# Patient Record
Sex: Male | Born: 1970 | Race: White | Hispanic: No | Marital: Married | Smoking: Former smoker
Health system: Southern US, Community
[De-identification: ages and names within clinical notes are randomized; demographics above are authoritative.]

## PROBLEM LIST (undated history)

## (undated) DIAGNOSIS — I10 Essential (primary) hypertension: Secondary | ICD-10-CM

## (undated) DIAGNOSIS — H3552 Pigmentary retinal dystrophy: Secondary | ICD-10-CM

## (undated) HISTORY — PX: HERNIA REPAIR: SHX51

---

## 2004-08-24 ENCOUNTER — Emergency Department (HOSPITAL_COMMUNITY): Admission: EM | Admit: 2004-08-24 | Discharge: 2004-08-25 | Payer: Self-pay | Admitting: *Deleted

## 2009-07-19 ENCOUNTER — Emergency Department (HOSPITAL_COMMUNITY): Admission: EM | Admit: 2009-07-19 | Discharge: 2009-07-20 | Payer: Self-pay | Admitting: Emergency Medicine

## 2009-08-20 ENCOUNTER — Ambulatory Visit (HOSPITAL_COMMUNITY): Admission: RE | Admit: 2009-08-20 | Discharge: 2009-08-20 | Payer: Self-pay | Admitting: General Surgery

## 2011-01-27 LAB — BASIC METABOLIC PANEL
BUN: 9 mg/dL (ref 6–23)
CO2: 31 mEq/L (ref 19–32)
Calcium: 9.5 mg/dL (ref 8.4–10.5)
Chloride: 105 mEq/L (ref 96–112)
Creatinine, Ser: 0.89 mg/dL (ref 0.4–1.5)
GFR calc Af Amer: >60 mL/min (ref >60)
GFR calc non Af Amer: >60 mL/min (ref >60)
Glucose, Bld: 110 mg/dL — ABNORMAL HIGH (ref 70–99)
Potassium: 4.3 mEq/L (ref 3.5–5.1)
Sodium: 141 mEq/L (ref 135–145)

## 2011-01-27 LAB — CBC
HCT: 46.2 % (ref 39.0–52.0)
Hemoglobin: 15.7 g/dL (ref 13.0–17.0)
MCHC: 34 g/dL (ref 30.0–36.0)
MCV: 94.7 fL (ref 78.0–100.0)
Platelets: 204 10*3/uL (ref 150–400)
RBC: 4.87 MIL/uL (ref 4.22–5.81)
RDW: 13.2 % (ref 11.5–15.5)
WBC: 11.3 10*3/uL — ABNORMAL HIGH (ref 4.0–10.5)

## 2011-01-27 LAB — DIFFERENTIAL
Basophils Absolute: 0.1 10*3/uL (ref 0.0–0.1)
Basophils Relative: 1 % (ref 0–1)
Eosinophils Absolute: 0.5 10*3/uL (ref 0.0–0.7)
Eosinophils Relative: 5 % (ref 0–5)
Lymphocytes Relative: 32 % (ref 12–46)
Lymphs Abs: 3.6 10*3/uL (ref 0.7–4.0)
Monocytes Absolute: 0.8 10*3/uL (ref 0.1–1.0)
Monocytes Relative: 7 % (ref 3–12)
Neutro Abs: 6.2 10*3/uL (ref 1.7–7.7)
Neutrophils Relative %: 55 % (ref 43–77)

## 2011-01-27 LAB — URINALYSIS, ROUTINE W REFLEX MICROSCOPIC
Bilirubin Urine: NEGATIVE
Glucose, UA: NEGATIVE mg/dL
Hgb urine dipstick: NEGATIVE
Ketones, ur: NEGATIVE mg/dL
Nitrite: NEGATIVE
Protein, ur: NEGATIVE mg/dL
Specific Gravity, Urine: 1.023 (ref 1.005–1.030)
Urobilinogen, UA: 0.2 mg/dL (ref 0.0–1.0)
pH: 5.5 (ref 5.0–8.0)

## 2011-01-27 LAB — PROTIME-INR
INR: 0.95 (ref 0.00–1.49)
Prothrombin Time: 12.6 seconds (ref 11.6–15.2)

## 2011-01-27 LAB — URINE MICROSCOPIC-ADD ON

## 2011-01-27 LAB — APTT: aPTT: 29 s (ref 24–37)

## 2014-11-25 ENCOUNTER — Ambulatory Visit (HOSPITAL_COMMUNITY)
Admission: RE | Admit: 2014-11-25 | Discharge: 2014-11-25 | Disposition: A | Discharge status: Home / Self Care | Payer: BLUE CROSS/BLUE SHIELD | Source: Ambulatory Visit | Attending: Family Medicine | Admitting: Family Medicine

## 2014-11-25 ENCOUNTER — Other Ambulatory Visit (HOSPITAL_COMMUNITY): Payer: Self-pay | Admitting: Family Medicine

## 2014-11-25 DIAGNOSIS — F17200 Nicotine dependence, unspecified, uncomplicated: Secondary | ICD-10-CM | POA: Insufficient documentation

## 2014-11-25 DIAGNOSIS — R1900 Intra-abdominal and pelvic swelling, mass and lump, unspecified site: Secondary | ICD-10-CM

## 2014-11-25 DIAGNOSIS — R222 Localized swelling, mass and lump, trunk: Secondary | ICD-10-CM | POA: Insufficient documentation

## 2014-11-25 DIAGNOSIS — N5089 Other specified disorders of the male genital organs: Secondary | ICD-10-CM

## 2014-11-26 ENCOUNTER — Other Ambulatory Visit (HOSPITAL_COMMUNITY): Payer: Self-pay | Admitting: Family Medicine

## 2014-11-26 DIAGNOSIS — N5089 Other specified disorders of the male genital organs: Secondary | ICD-10-CM

## 2014-11-26 DIAGNOSIS — N63 Unspecified lump in unspecified breast: Secondary | ICD-10-CM

## 2014-12-01 ENCOUNTER — Ambulatory Visit (HOSPITAL_COMMUNITY)
Admission: RE | Admit: 2014-12-01 | Discharge: 2014-12-01 | Disposition: A | Discharge status: Home / Self Care | Payer: BLUE CROSS/BLUE SHIELD | Source: Ambulatory Visit | Attending: Family Medicine | Admitting: Family Medicine

## 2014-12-01 DIAGNOSIS — N508 Other specified disorders of male genital organs: Secondary | ICD-10-CM | POA: Insufficient documentation

## 2014-12-01 DIAGNOSIS — N5089 Other specified disorders of the male genital organs: Secondary | ICD-10-CM

## 2014-12-09 ENCOUNTER — Other Ambulatory Visit (HOSPITAL_COMMUNITY): Payer: Self-pay | Admitting: Family Medicine

## 2014-12-09 ENCOUNTER — Ambulatory Visit (HOSPITAL_COMMUNITY)
Admission: RE | Admit: 2014-12-09 | Discharge: 2014-12-09 | Disposition: A | Discharge status: Home / Self Care | Payer: BLUE CROSS/BLUE SHIELD | Source: Ambulatory Visit | Attending: Family Medicine | Admitting: Family Medicine

## 2014-12-09 DIAGNOSIS — N63 Unspecified lump in unspecified breast: Secondary | ICD-10-CM

## 2015-06-18 ENCOUNTER — Ambulatory Visit: Payer: Worker's Compensation

## 2015-06-18 ENCOUNTER — Ambulatory Visit
Admission: EM | Admit: 2015-06-18 | Discharge: 2015-06-18 | Disposition: A | Discharge status: Home / Self Care | Payer: Worker's Compensation | Attending: Family Medicine | Admitting: Family Medicine

## 2015-06-18 DIAGNOSIS — S62512A Displaced fracture of proximal phalanx of left thumb, initial encounter for closed fracture: Secondary | ICD-10-CM

## 2015-06-18 HISTORY — DX: Pigmentary retinal dystrophy: H35.52

## 2015-06-18 HISTORY — DX: Essential (primary) hypertension: I10

## 2015-06-18 MED ORDER — MELOXICAM 15 MG PO TABS: 15.0000 mg | ORAL_TABLET | Freq: Every day | ORAL | Status: DC

## 2015-06-18 NOTE — Discharge Instructions (Signed)
Cast or Splint Care Casts and splints support injured limbs and keep bones from moving while they heal.  HOME CARE  Keep the cast or splint uncovered during the drying period.  A plaster cast can take 24 to 48 hours to dry.  A fiberglass cast will dry in less than 1 hour.  Do not rest the cast on anything harder than a pillow for 24 hours.  Do not put weight on your injured limb. Do not put pressure on the cast. Wait for your doctor's approval.  Keep the cast or splint dry.  Cover the cast or splint with a plastic bag during baths or wet weather.  If you have a cast over your chest and belly (trunk), take sponge baths until the cast is taken off.  If your cast gets wet, dry it with a towel or blow dryer. Use the cool setting on the blow dryer.  Keep your cast or splint clean. Wash a dirty cast with a damp cloth.  Do not put any objects under your cast or splint.  Do not scratch the skin under the cast with an object. If itching is a problem, use a blow dryer on a cool setting over the itchy area.  Do not trim or cut your cast.  Do not take out the padding from inside your cast.  Exercise your joints near the cast as told by your doctor.  Raise (elevate) your injured limb on 1 or 2 pillows for the first 1 to 3 days. GET HELP IF:  Your cast or splint cracks.  Your cast or splint is too tight or too loose.  You itch badly under the cast.  Your cast gets wet or has a soft spot.  You have a bad smell coming from the cast.  You get an object stuck under the cast.  Your skin around the cast becomes red or sore.  You have new or more pain after the cast is put on. GET HELP RIGHT AWAY IF:  You have fluid leaking through the cast.  You cannot move your fingers or toes.  Your fingers or toes turn blue or white or are cool, painful, or puffy (swollen).  You have tingling or lose feeling (numbness) around the injured area.  You have bad pain or pressure under the  cast.  You have trouble breathing or have shortness of breath.  You have chest pain. Document Released: 02/09/2011 Document Revised: 06/12/2013 Document Reviewed: 04/18/2013 [facility] Patient Information 2015 [place], [place]. This information is not intended to replace advice given to you by your health care provider. Make sure you discuss any questions you have with your health care provider.  Finger Fracture A finger fracture is when one or more bones in the finger break.  HOME CARE   Wear the splint, tape, or cast as long as told by your doctor.  Keep your fingers in the position your doctor tell you to.  Raise (elevate) the injured area above the level of the heart.  Only take medicine as told by your doctor.  Put ice on the injured area.  Put ice in a plastic bag.  Place a towel between the skin and the bag.  Leave the ice on for 15-20 minutes, 03-04 times a day.  Follow up with your doctor.  Ask what exercises you can do when the splint comes off. GET HELP RIGHT AWAY IF:   The fingernails are white or bluish.  You have pain not helped by medicine.  You cannot move your fingertips.  You lose feeling (numbness) in the injured finger(s). MAKE SURE YOU:   Understand these instructions.  Will watch this condition.  Will get help right away if you are not doing well or get worse. Document Released: 03/28/2008 Document Revised: 01/02/2012 Document Reviewed: 03/28/2008 [facility] Patient Information 2015 [place], [place]. This information is not intended to replace advice given to you by your health care provider. Make sure you discuss any questions you have with your health care provider.  Thumb Fracture  There are many types of thumb fractures (breaks). There are different ways of treating these fractures, all of which may be correct, varying from case to case. Your caregiver will discuss different ways to treat these fractures with you. TREATMENT   Immobilization.  This means the fracture is casted as it is without changing the positions of the fracture (bone pieces) involved. This fracture is casted in a "thumb spica" also called a hitchhiker cast. It is generally left on for 2 to 6 weeks.  Closed reduction. The bones are manipulated back into position without using surgery.  ORIF (open reduction and internal fixation). The fracture site is opened and the bone pieces are fixed into place with some type of hardware such as screws or wires. Your caregiver will discuss the type of fracture you have and the treatment that will be best for that problem. If surgery is the treatment of choice, the following is information for you to know and to let your caregiver know about prior to surgery. LET YOUR CAREGIVERS KNOW ABOUT:  Allergies.  Medications taken including herbs, eye drops, over the counter medications, and creams.  Use of steroids (by mouth or creams).  Previous problems with anesthetics or Novocain.  Family history of anesthetic complications..  Possibility of pregnancy, if this applies.  History of blood clots (thrombophlebitis).  History of bleeding or blood problems.  Previous surgery.  Other health problems. AFTER THE PROCEDURE  After surgery, you will be taken to the recovery area. A nurse will watch and check your progress. Once you are awake, stable, and taking fluids well, barring other problems you will be allowed to go home. Once home, an ice pack applied to your operative site may help with discomfort and keep the swelling down. Elevate your hand above your heart as much as possible for the first 4-5 days after the injury/surgery. HOME CARE INSTRUCTIONS   Follow your caregiver's instructions as to activities, exercises, physical therapy, and driving a car.  Use thumb and exercise as directed.  Only take over-the-counter or prescription medicines for pain, discomfort, or fever as directed by your caregiver. Do not take aspirin  until your caregiver instructs. This can increase bleeding immediately following surgery. SEEK MEDICAL CARE IF:   There is increased bleeding (more than a small spot) from the wound or from beneath your cast or splint.  There is redness, swelling, or increasing pain in the wound or from beneath your cast or splint.  You have pus coming from wound or from beneath your cast or splint.  An unexplained oral temperature above 102 F (38.9 C) develops.  There is a foul smell coming from the wound or dressing or from beneath your cast or splint. SEEK IMMEDIATE MEDICAL CARE IF:   You develop severe pain, decreased sensation such as numbness or tingling.  You develop a rash.  You have difficulty breathing.  Youhave any allergic problems. If you do not have a window in your cast for  observing the wound, a discharge or minor bleeding may show up as a stain on the outside of your cast. Report these findings to your caregiver. If you have a removable splint overlying the surgical dressings it is common to see a small amount of bleeding. Change the dressings as instructed by your caregiver. Document Released: 07/09/2003 Document Revised: 01/02/2012 Document Reviewed: 12/13/2013 [facility] Patient Information 2015 [place], [place]. This information is not intended to replace advice given to you by your health care provider. Make sure you discuss any questions you have with your health care provider.

## 2015-06-18 NOTE — ED Notes (Signed)
Pt states "I was on the third step of a short ladder, so my fall was 4 ft at the most."

## 2015-06-18 NOTE — ED Notes (Addendum)
Pt states "I was on a ladder at work yesterday and missed a step on the ladder and fell to the floor, I landed on my left hand, and am having left thumb pain and swelling. I also scraped my left shoulder but it feels okay this morning, kind of like a rug burn." Pt denies hitting head, no LOC.

## 2015-06-19 NOTE — ED Provider Notes (Signed)
CSN: [id number]     Arrival date & time 06/18/15  0848 History   First MD Initiated Contact with Patient 06/18/15 [phone]     Chief Complaint  Patient presents with  . Hand Injury  . Fall   patient reports falling on his left hand to catch himself when he fell off stepladder landing on his left hand flexed at the wrist. He denies any head trauma or head injury but states that his left hand started hurting and progressively got worse. The injury occurred on Monday during the day. (Consider location/radiation/quality/duration/timing/severity/associated sxs/prior Treatment) Patient is a 44 y.o. male presenting with hand injury and fall. The history is provided by the patient. No language interpreter was used.  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 Hypertension   . Retinitis pigmentosa    Past Surgical History  Procedure Laterality Date  . Hernia repair     History reviewed. No pertinent family history. Social History  Substance Use Topics  . Smoking status: Current Every Day Smoker -- 1.00 packs/day  . Smokeless tobacco: None  . Alcohol Use: No    Review of Systems  Musculoskeletal: Negative for back pain and neck pain.  All other systems reviewed and are negative.   Allergies  Review of patient's allergies indicates no known allergies.  Home Medications   Prior  to Admission medications   Medication Sig Start Date End Date Taking? Authorizing Provider  HYDROcodone-acetaminophen (NORCO/VICODIN) 5-325 MG per tablet Take 1 tablet by mouth every 6 (six) hours as needed for moderate pain (Tooth extraction).   Yes Historical Provider, MD  meloxicam (MOBIC) 15 MG tablet Take 1 tablet (15 mg total) by mouth daily. 06/18/15   Hassan Rowan, MD   Meds Ordered and Administered this Visit  Medications - No data to display  BP 154/100 mmHg  Pulse 75  Temp(Src) 98.5 F (36.9 C) (Tympanic)  Resp 16  Ht 5\' 9"  (1.753 m)  Wt 230 lb (104.327 kg)  BMI 33.95 kg/m2  SpO2 99% No data found.   Physical Exam  Constitutional: He is oriented to person, place, and time. He appears well-developed and well-nourished.  HENT:  Head: Normocephalic.  Musculoskeletal:       Left hand: He exhibits tenderness and bony tenderness.       Hands: The left hand swollen specialist thumb this tenderness in the navicular area and over the metacarpal and phalangeal area of the thumb. She has difficulty flexing the thumb and the thumb.  Neurological: He is alert and oriented to person, place, and time.  Skin: Skin is warm and dry. No erythema.  Vitals reviewed.   ED Course  Procedures (including critical care time)  Labs Review Labs Reviewed -  No data to display  Imaging Review Dg Wrist Complete Left  06/18/2015   CLINICAL DATA:  Fall off ladder yesterday with left wrist injury and pain. Initial encounter.  EXAM: LEFT WRIST - COMPLETE 3+ VIEW  COMPARISON:  None.  FINDINGS: There is no evidence of fracture or dislocation. There is no evidence of arthropathy or other focal bone abnormality. Soft tissue swelling overlying the proximal fifth metatarsal is noted.  IMPRESSION: Soft tissue swelling without bony abnormality.   Electronically Signed   By: Harmon Pier M.D.   On: 06/18/2015 10:41   Dg Hand Complete Left  06/18/2015   CLINICAL DATA:  Fall off ladder, left wrist/hand injury,  pain at base of thumb  EXAM: LEFT HAND - COMPLETE 3+ VIEW  COMPARISON:  None.  FINDINGS: Nondisplaced fracture at the base of the 1st proximal phalanx.  The joint spaces are preserved.  Visualized soft tissues are within normal limits.  IMPRESSION: Nondisplaced fracture at the base of the 1st proximal phalanx.   Electronically Signed   By: Charline Bills M.D.   On: 06/18/2015 10:45     Visual Acuity Review  Right Eye Distance:   Left Eye Distance:   Bilateral Distance:    Right Eye Near:   Left Eye Near:    Bilateral Near:        MDM   1. Fracture of proximal phalanx of thumb, left, closed, initial encounter    Initially concerned about patient fall may have injured his navicular bone but x-ray was negative for navicular fracture.    patient with fracture of the left proximal phalanx. Because of the fracture will be referred to orthopedic. Mobic 15 mg 1 tablet day thenar gutter  splint will be applied as well. Explained to him he will need to see an orthopedic approved by his workers comp and Oncologist people. No use of his left hand until cleared by the orthopedic.   Hassan Rowan, MD 06/19/15 2240

## 2016-02-05 IMAGING — CR DG WRIST COMPLETE 3+V*L*
4 series · 4 of 4 positions shown · non-contrast
Comparison: None.

CLINICAL DATA: Fall off ladder yesterday with left wrist injury and
pain. Initial encounter.

EXAM:
LEFT WRIST - COMPLETE 3+ VIEW

[wrist pa (1 of 2)]
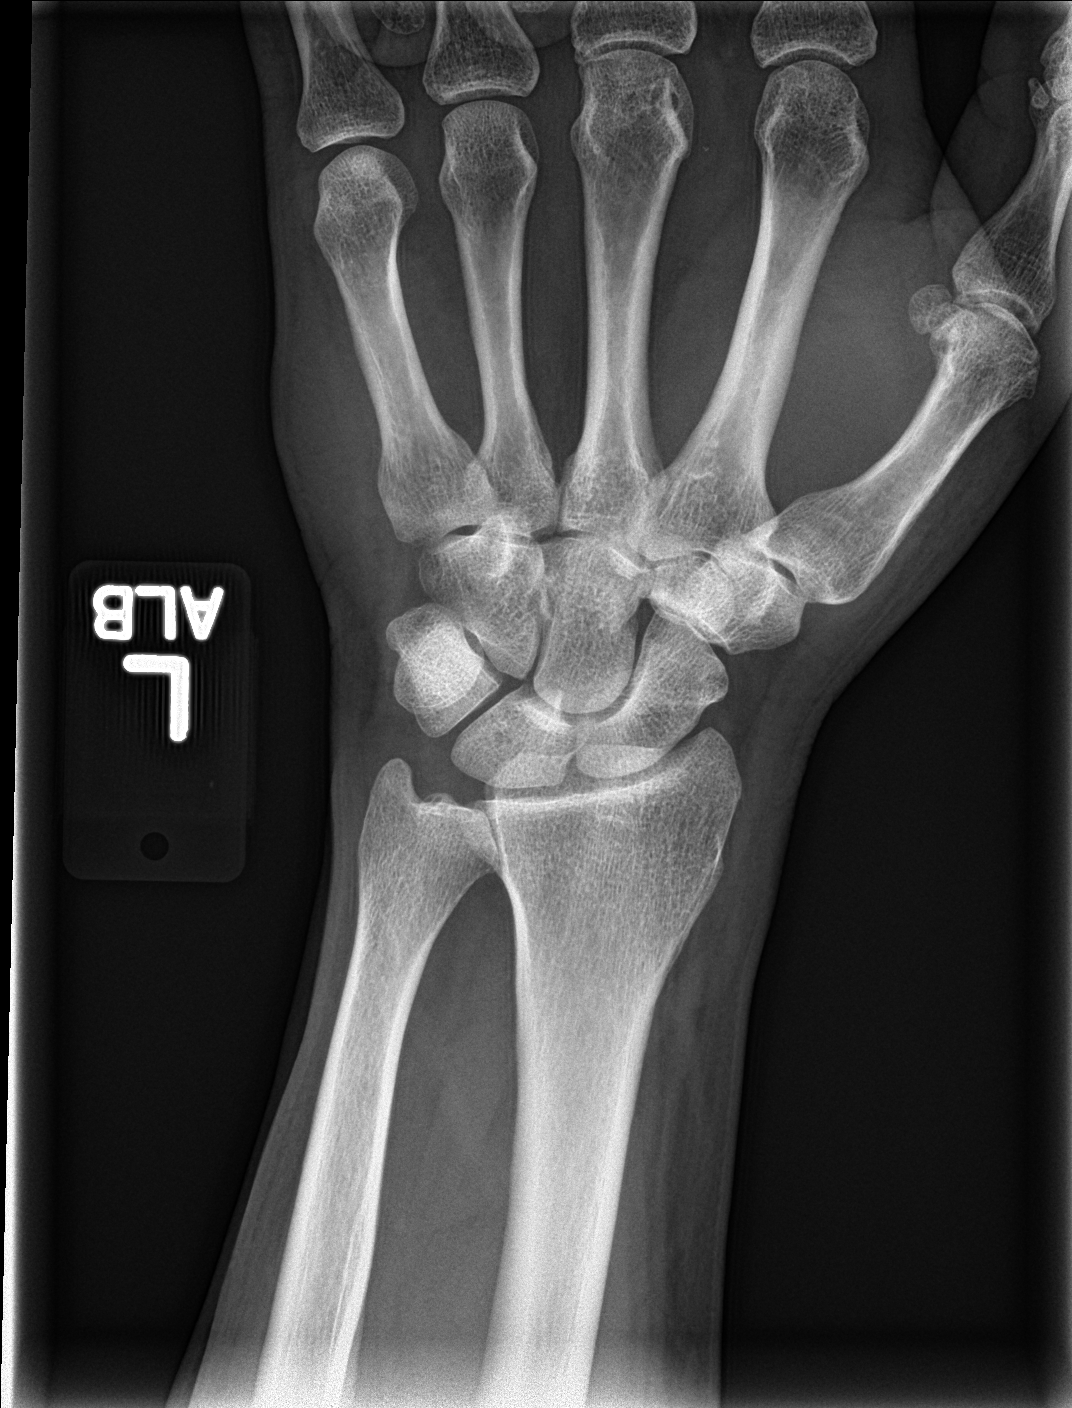

[wrist obl]
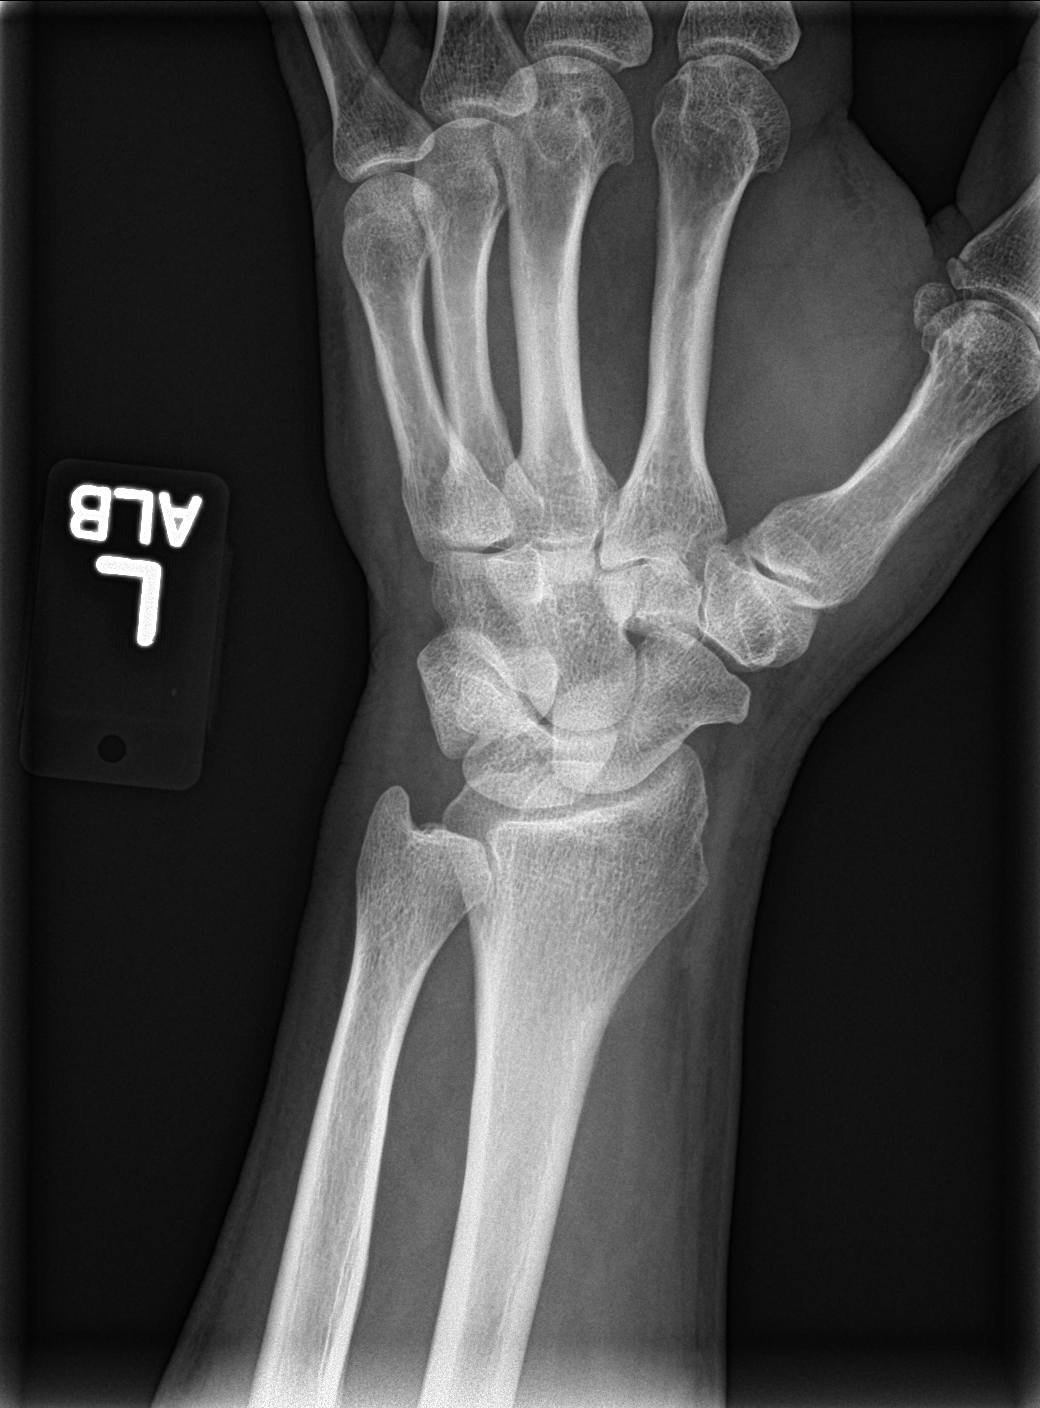

[wrist lat]
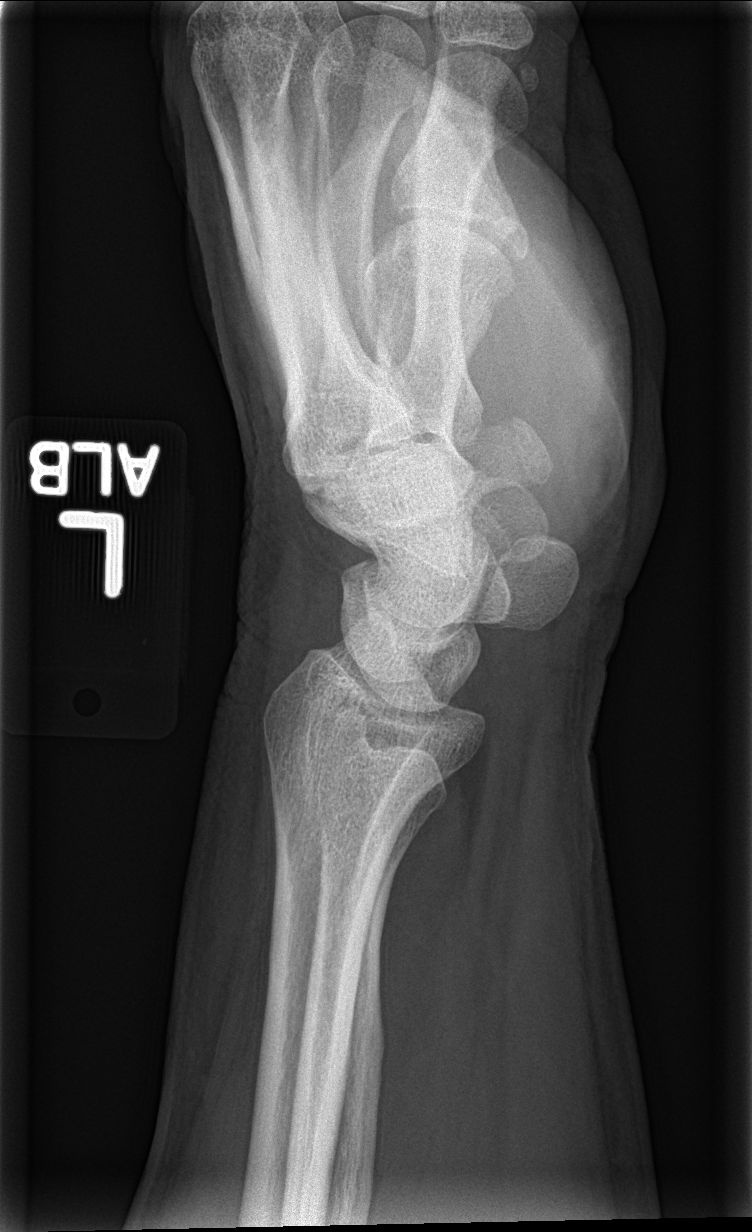

[wrist pa (2 of 2)]
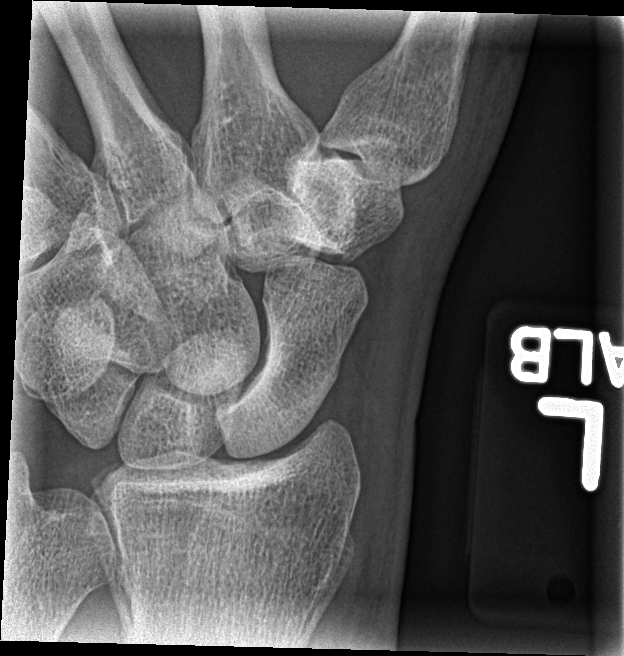

[4 of 4 positions shown; findings below may reference images not displayed]

FINDINGS: There is no evidence of fracture or dislocation. There is no
evidence of arthropathy or other focal bone abnormality. Soft tissue
swelling overlying the proximal fifth metatarsal is noted.
IMPRESSION: Soft tissue swelling without bony abnormality.

## 2020-05-12 DIAGNOSIS — I10 Essential (primary) hypertension: Secondary | ICD-10-CM | POA: Diagnosis not present

## 2020-05-12 DIAGNOSIS — J329 Chronic sinusitis, unspecified: Secondary | ICD-10-CM | POA: Diagnosis not present

## 2020-05-21 DIAGNOSIS — H25812 Combined forms of age-related cataract, left eye: Secondary | ICD-10-CM | POA: Diagnosis not present

## 2020-05-21 DIAGNOSIS — Z961 Presence of intraocular lens: Secondary | ICD-10-CM | POA: Diagnosis not present

## 2020-05-21 DIAGNOSIS — H3552 Pigmentary retinal dystrophy: Secondary | ICD-10-CM | POA: Diagnosis not present

## 2020-05-21 DIAGNOSIS — B399 Histoplasmosis, unspecified: Secondary | ICD-10-CM | POA: Diagnosis not present

## 2020-05-28 DIAGNOSIS — H26491 Other secondary cataract, right eye: Secondary | ICD-10-CM | POA: Diagnosis not present

## 2020-06-17 DIAGNOSIS — Z961 Presence of intraocular lens: Secondary | ICD-10-CM | POA: Diagnosis not present

## 2020-06-17 DIAGNOSIS — H25812 Combined forms of age-related cataract, left eye: Secondary | ICD-10-CM | POA: Diagnosis not present

## 2020-06-17 DIAGNOSIS — H3552 Pigmentary retinal dystrophy: Secondary | ICD-10-CM | POA: Diagnosis not present

## 2020-06-17 DIAGNOSIS — B399 Histoplasmosis, unspecified: Secondary | ICD-10-CM | POA: Diagnosis not present

## 2020-06-18 DIAGNOSIS — H3552 Pigmentary retinal dystrophy: Secondary | ICD-10-CM | POA: Diagnosis not present

## 2020-07-02 DIAGNOSIS — J329 Chronic sinusitis, unspecified: Secondary | ICD-10-CM | POA: Diagnosis not present

## 2020-07-02 DIAGNOSIS — Z20828 Contact with and (suspected) exposure to other viral communicable diseases: Secondary | ICD-10-CM | POA: Diagnosis not present

## 2020-07-17 DIAGNOSIS — H25812 Combined forms of age-related cataract, left eye: Secondary | ICD-10-CM | POA: Diagnosis not present

## 2020-08-20 DIAGNOSIS — M722 Plantar fascial fibromatosis: Secondary | ICD-10-CM | POA: Diagnosis not present

## 2020-10-19 DIAGNOSIS — Z20822 Contact with and (suspected) exposure to covid-19: Secondary | ICD-10-CM | POA: Diagnosis not present

## 2020-10-19 DIAGNOSIS — R051 Acute cough: Secondary | ICD-10-CM | POA: Diagnosis not present

## 2020-10-19 DIAGNOSIS — J019 Acute sinusitis, unspecified: Secondary | ICD-10-CM | POA: Diagnosis not present

## 2020-11-04 DIAGNOSIS — U071 COVID-19: Secondary | ICD-10-CM | POA: Diagnosis not present

## 2020-12-11 DIAGNOSIS — M1990 Unspecified osteoarthritis, unspecified site: Secondary | ICD-10-CM | POA: Diagnosis not present

## 2020-12-11 DIAGNOSIS — Z131 Encounter for screening for diabetes mellitus: Secondary | ICD-10-CM | POA: Diagnosis not present

## 2020-12-11 DIAGNOSIS — R252 Cramp and spasm: Secondary | ICD-10-CM | POA: Diagnosis not present

## 2020-12-11 DIAGNOSIS — I10 Essential (primary) hypertension: Secondary | ICD-10-CM | POA: Diagnosis not present

## 2020-12-11 DIAGNOSIS — Z13220 Encounter for screening for lipoid disorders: Secondary | ICD-10-CM | POA: Diagnosis not present

## 2020-12-22 DIAGNOSIS — I219 Acute myocardial infarction, unspecified: Secondary | ICD-10-CM

## 2020-12-22 HISTORY — DX: Acute myocardial infarction, unspecified: I21.9

## 2020-12-24 ENCOUNTER — Inpatient Hospital Stay
Admission: EM | Admit: 2020-12-24 | Discharge: 2020-12-27 | DRG: 247 | Disposition: A | Discharge status: Home / Self Care | Payer: BC Managed Care – PPO | Attending: Internal Medicine | Admitting: Internal Medicine

## 2020-12-24 DIAGNOSIS — E876 Hypokalemia: Secondary | ICD-10-CM | POA: Diagnosis not present

## 2020-12-24 DIAGNOSIS — Z833 Family history of diabetes mellitus: Secondary | ICD-10-CM | POA: Diagnosis not present

## 2020-12-24 DIAGNOSIS — I2111 ST elevation (STEMI) myocardial infarction involving right coronary artery: Secondary | ICD-10-CM | POA: Diagnosis not present

## 2020-12-24 DIAGNOSIS — I10 Essential (primary) hypertension: Secondary | ICD-10-CM | POA: Diagnosis present

## 2020-12-24 DIAGNOSIS — I200 Unstable angina: Secondary | ICD-10-CM

## 2020-12-24 DIAGNOSIS — Z20822 Contact with and (suspected) exposure to covid-19: Secondary | ICD-10-CM | POA: Diagnosis present

## 2020-12-24 DIAGNOSIS — I213 ST elevation (STEMI) myocardial infarction of unspecified site: Secondary | ICD-10-CM | POA: Diagnosis not present

## 2020-12-24 DIAGNOSIS — D72829 Elevated white blood cell count, unspecified: Secondary | ICD-10-CM | POA: Diagnosis present

## 2020-12-24 DIAGNOSIS — I2119 ST elevation (STEMI) myocardial infarction involving other coronary artery of inferior wall: Principal | ICD-10-CM | POA: Diagnosis present

## 2020-12-24 DIAGNOSIS — Z8249 Family history of ischemic heart disease and other diseases of the circulatory system: Secondary | ICD-10-CM | POA: Diagnosis not present

## 2020-12-24 DIAGNOSIS — R0789 Other chest pain: Secondary | ICD-10-CM | POA: Diagnosis not present

## 2020-12-24 DIAGNOSIS — R079 Chest pain, unspecified: Secondary | ICD-10-CM | POA: Diagnosis not present

## 2020-12-24 DIAGNOSIS — F17210 Nicotine dependence, cigarettes, uncomplicated: Secondary | ICD-10-CM | POA: Diagnosis present

## 2020-12-24 DIAGNOSIS — Z9861 Coronary angioplasty status: Secondary | ICD-10-CM | POA: Diagnosis not present

## 2020-12-24 DIAGNOSIS — R0602 Shortness of breath: Secondary | ICD-10-CM | POA: Diagnosis not present

## 2020-12-24 DIAGNOSIS — I25110 Atherosclerotic heart disease of native coronary artery with unstable angina pectoris: Secondary | ICD-10-CM | POA: Diagnosis present

## 2020-12-24 DIAGNOSIS — I499 Cardiac arrhythmia, unspecified: Secondary | ICD-10-CM | POA: Diagnosis not present

## 2020-12-24 DIAGNOSIS — Z720 Tobacco use: Secondary | ICD-10-CM | POA: Diagnosis not present

## 2020-12-25 ENCOUNTER — Inpatient Hospital Stay
Admit: 2020-12-25 | Discharge: 2020-12-25 | Disposition: A | Discharge status: Home / Self Care | Payer: BC Managed Care – PPO | Attending: Cardiology | Admitting: Cardiology

## 2020-12-25 ENCOUNTER — Emergency Department: Payer: BC Managed Care – PPO

## 2020-12-25 ENCOUNTER — Encounter
Admission: EM | Disposition: A | Discharge status: Home / Self Care | Payer: Self-pay | Source: Home / Self Care | Attending: Internal Medicine

## 2020-12-25 ENCOUNTER — Other Ambulatory Visit: Payer: Self-pay

## 2020-12-25 DIAGNOSIS — Z20822 Contact with and (suspected) exposure to covid-19: Secondary | ICD-10-CM | POA: Diagnosis present

## 2020-12-25 DIAGNOSIS — F17210 Nicotine dependence, cigarettes, uncomplicated: Secondary | ICD-10-CM | POA: Diagnosis present

## 2020-12-25 DIAGNOSIS — I2119 ST elevation (STEMI) myocardial infarction involving other coronary artery of inferior wall: Secondary | ICD-10-CM | POA: Diagnosis present

## 2020-12-25 DIAGNOSIS — Z720 Tobacco use: Secondary | ICD-10-CM

## 2020-12-25 DIAGNOSIS — I10 Essential (primary) hypertension: Secondary | ICD-10-CM

## 2020-12-25 DIAGNOSIS — I213 ST elevation (STEMI) myocardial infarction of unspecified site: Secondary | ICD-10-CM | POA: Diagnosis present

## 2020-12-25 DIAGNOSIS — Z833 Family history of diabetes mellitus: Secondary | ICD-10-CM | POA: Diagnosis not present

## 2020-12-25 DIAGNOSIS — I2111 ST elevation (STEMI) myocardial infarction involving right coronary artery: Secondary | ICD-10-CM

## 2020-12-25 DIAGNOSIS — I25110 Atherosclerotic heart disease of native coronary artery with unstable angina pectoris: Secondary | ICD-10-CM | POA: Diagnosis present

## 2020-12-25 DIAGNOSIS — I200 Unstable angina: Secondary | ICD-10-CM | POA: Diagnosis present

## 2020-12-25 DIAGNOSIS — E876 Hypokalemia: Secondary | ICD-10-CM | POA: Diagnosis not present

## 2020-12-25 DIAGNOSIS — Z8249 Family history of ischemic heart disease and other diseases of the circulatory system: Secondary | ICD-10-CM | POA: Diagnosis not present

## 2020-12-25 DIAGNOSIS — D72829 Elevated white blood cell count, unspecified: Secondary | ICD-10-CM | POA: Diagnosis present

## 2020-12-25 HISTORY — PX: CARDIAC CATHETERIZATION: SHX172

## 2020-12-25 HISTORY — PX: LEFT HEART CATH AND CORONARY ANGIOGRAPHY: CATH118249

## 2020-12-25 HISTORY — PX: CORONARY/GRAFT ACUTE MI REVASCULARIZATION: CATH118305

## 2020-12-25 LAB — RESP PANEL BY RT-PCR (FLU A&B, COVID) ARPGX2
Influenza A by PCR: NEGATIVE
Influenza B by PCR: NEGATIVE
SARS Coronavirus 2 by RT PCR: NEGATIVE

## 2020-12-25 LAB — COMPREHENSIVE METABOLIC PANEL
ALT: 31 U/L (ref 0–44)
AST: 31 U/L (ref 15–41)
Albumin: 4.1 g/dL (ref 3.5–5.0)
Alkaline Phosphatase: 65 U/L (ref 38–126)
Anion gap: 10 (ref 5–15)
BUN: 13 mg/dL (ref 6–20)
CO2: 24 mmol/L (ref 22–32)
Calcium: 8.8 mg/dL — ABNORMAL LOW (ref 8.9–10.3)
Chloride: 105 mmol/L (ref 98–111)
Creatinine, Ser: 1.07 mg/dL (ref 0.61–1.24)
GFR, Estimated: >60 mL/min (ref >60)
Glucose, Bld: 194 mg/dL — ABNORMAL HIGH (ref 70–99)
Potassium: 3.1 mmol/L — ABNORMAL LOW (ref 3.5–5.1)
Sodium: 139 mmol/L (ref 135–145)
Total Bilirubin: 0.6 mg/dL (ref 0.3–1.2)
Total Protein: 7.2 g/dL (ref 6.5–8.1)

## 2020-12-25 LAB — CBC WITH DIFFERENTIAL/PLATELET
Abs Immature Granulocytes: 0.12 10*3/uL — ABNORMAL HIGH (ref 0.00–0.07)
Basophils Absolute: 0.1 10*3/uL (ref 0.0–0.1)
Basophils Relative: 1 %
Eosinophils Absolute: 0.6 10*3/uL — ABNORMAL HIGH (ref 0.0–0.5)
Eosinophils Relative: 3 %
HCT: 45.6 % (ref 39.0–52.0)
Hemoglobin: 15.2 g/dL (ref 13.0–17.0)
Immature Granulocytes: 1 %
Lymphocytes Relative: 31 %
Lymphs Abs: 5.6 10*3/uL — ABNORMAL HIGH (ref 0.7–4.0)
MCH: 31.3 pg (ref 26.0–34.0)
MCHC: 33.3 g/dL (ref 30.0–36.0)
MCV: 93.8 fL (ref 80.0–100.0)
Monocytes Absolute: 1.4 10*3/uL — ABNORMAL HIGH (ref 0.1–1.0)
Monocytes Relative: 8 %
Neutro Abs: 10.4 10*3/uL — ABNORMAL HIGH (ref 1.7–7.7)
Neutrophils Relative %: 56 %
Platelets: 263 10*3/uL (ref 150–400)
RBC: 4.86 MIL/uL (ref 4.22–5.81)
RDW: 12.6 % (ref 11.5–15.5)
WBC: 18 10*3/uL — ABNORMAL HIGH (ref 4.0–10.5)
nRBC: 0 % (ref 0.0–0.2)

## 2020-12-25 LAB — CBC
HCT: 43.5 % (ref 39.0–52.0)
Hemoglobin: 14.6 g/dL (ref 13.0–17.0)
MCH: 31.3 pg (ref 26.0–34.0)
MCHC: 33.6 g/dL (ref 30.0–36.0)
MCV: 93.3 fL (ref 80.0–100.0)
Platelets: 234 10*3/uL (ref 150–400)
RBC: 4.66 MIL/uL (ref 4.22–5.81)
RDW: 12.7 % (ref 11.5–15.5)
WBC: 15.9 10*3/uL — ABNORMAL HIGH (ref 4.0–10.5)
nRBC: 0 % (ref 0.0–0.2)

## 2020-12-25 LAB — ECHOCARDIOGRAM COMPLETE
AR max vel: 1.93 cm2
AV Area VTI: 2.37 cm2
AV Area mean vel: 2.05 cm2
AV Mean grad: 3 mmHg
AV Peak grad: 6.3 mmHg
Ao pk vel: 1.25 m/s
Area-P 1/2: 4.74 cm2
Height: 68.898 in
S' Lateral: 2.73 cm
Weight: 3717.84 oz

## 2020-12-25 LAB — PROTIME-INR
INR: 1.1 (ref 0.8–1.2)
Prothrombin Time: 13.5 seconds (ref 11.4–15.2)

## 2020-12-25 LAB — POCT ACTIVATED CLOTTING TIME
Activated Clotting Time: 220 seconds
Activated Clotting Time: 392 seconds

## 2020-12-25 LAB — TROPONIN I (HIGH SENSITIVITY)
Troponin I (High Sensitivity): 1286 ng/L (ref <18)
Troponin I (High Sensitivity): >27000 ng/L (ref <18)

## 2020-12-25 LAB — MRSA PCR SCREENING: MRSA by PCR: NEGATIVE

## 2020-12-25 LAB — GLUCOSE, CAPILLARY: Glucose-Capillary: 144 mg/dL — ABNORMAL HIGH (ref 70–99)

## 2020-12-25 SURGERY — CORONARY/GRAFT ACUTE MI REVASCULARIZATION
Anesthesia: Moderate Sedation

## 2020-12-25 MED ORDER — HEPARIN SODIUM (PORCINE) 1000 UNIT/ML IJ SOLN
INTRAMUSCULAR | Status: DC | PRN
  Administered 2020-12-25: 5000 [IU] via INTRAVENOUS
  Administered 2020-12-25: 8000 [IU] via INTRAVENOUS

## 2020-12-25 MED ORDER — HEPARIN (PORCINE) IN NACL 1000-0.9 UT/500ML-% IV SOLN
INTRAVENOUS | Status: DC | PRN
  Administered 2020-12-25 (×2): 500 mL

## 2020-12-25 MED ORDER — IOHEXOL 300 MG/ML  SOLN
INTRAMUSCULAR | Status: DC | PRN
  Administered 2020-12-25: 290 mL

## 2020-12-25 MED ORDER — HEPARIN (PORCINE) IN NACL 1000-0.9 UT/500ML-% IV SOLN
INTRAVENOUS | Status: AC
  Filled 2020-12-25: qty 1000

## 2020-12-25 MED ORDER — NITROGLYCERIN 0.4 MG SL SUBL: 0.4000 mg | SUBLINGUAL_TABLET | SUBLINGUAL | Status: DC | PRN

## 2020-12-25 MED ORDER — MORPHINE SULFATE (PF) 2 MG/ML IV SOLN
2.0000 mg | INTRAVENOUS | Status: DC | PRN
Start: 2020-12-25 — End: 2020-12-27
  Administered 2020-12-25: 2 mg via INTRAVENOUS
  Filled 2020-12-25: qty 1

## 2020-12-25 MED ORDER — LIDOCAINE HCL (PF) 1 % IJ SOLN
INTRAMUSCULAR | Status: AC
  Filled 2020-12-25: qty 30

## 2020-12-25 MED ORDER — SODIUM CHLORIDE 0.9% FLUSH
3.0000 mL | Freq: Two times a day (BID) | INTRAVENOUS | Status: DC
  Administered 2020-12-25 – 2020-12-27 (×5): 3 mL via INTRAVENOUS

## 2020-12-25 MED ORDER — HEPARIN SODIUM (PORCINE) 1000 UNIT/ML IJ SOLN
INTRAMUSCULAR | Status: AC
  Filled 2020-12-25: qty 1

## 2020-12-25 MED ORDER — FENTANYL CITRATE (PF) 100 MCG/2ML IJ SOLN
INTRAMUSCULAR | Status: DC | PRN
  Administered 2020-12-25 (×2): 50 ug via INTRAVENOUS

## 2020-12-25 MED ORDER — SODIUM CHLORIDE 0.9 % WEIGHT BASED INFUSION: INTRAVENOUS | Status: AC

## 2020-12-25 MED ORDER — MIDAZOLAM HCL 2 MG/2ML IJ SOLN
INTRAMUSCULAR | Status: DC | PRN
  Administered 2020-12-25 (×2): 1 mg via INTRAVENOUS

## 2020-12-25 MED ORDER — ACETAMINOPHEN 325 MG PO TABS
650.0000 mg | ORAL_TABLET | ORAL | Status: DC | PRN
  Administered 2020-12-25: 650 mg via ORAL
  Filled 2020-12-25: qty 2

## 2020-12-25 MED ORDER — SODIUM CHLORIDE 0.9% FLUSH: 3.0000 mL | INTRAVENOUS | Status: DC | PRN

## 2020-12-25 MED ORDER — TIROFIBAN (AGGRASTAT) BOLUS VIA INFUSION
INTRAVENOUS | Status: DC | PRN
  Administered 2020-12-25: 2687.55 ug via INTRAVENOUS

## 2020-12-25 MED ORDER — LIDOCAINE HCL (PF) 1 % IJ SOLN
INTRAMUSCULAR | Status: DC | PRN
  Administered 2020-12-25: 2 mL

## 2020-12-25 MED ORDER — METOPROLOL TARTRATE 25 MG PO TABS
12.5000 mg | ORAL_TABLET | Freq: Two times a day (BID) | ORAL | Status: DC
  Administered 2020-12-25 – 2020-12-27 (×3): 12.5 mg via ORAL
  Filled 2020-12-25 (×4): qty 1

## 2020-12-25 MED ORDER — FENTANYL CITRATE (PF) 100 MCG/2ML IJ SOLN
INTRAMUSCULAR | Status: AC
  Filled 2020-12-25: qty 2

## 2020-12-25 MED ORDER — VERAPAMIL HCL 2.5 MG/ML IV SOLN
INTRAVENOUS | Status: AC
  Filled 2020-12-25: qty 2

## 2020-12-25 MED ORDER — MORPHINE SULFATE (PF) 2 MG/ML IV SOLN
INTRAVENOUS | Status: AC
  Administered 2020-12-25: 2 mg via INTRAVENOUS
  Filled 2020-12-25: qty 1

## 2020-12-25 MED ORDER — HYDRALAZINE HCL 20 MG/ML IJ SOLN: 10.0000 mg | INTRAMUSCULAR | Status: AC | PRN

## 2020-12-25 MED ORDER — MIDAZOLAM HCL 2 MG/2ML IJ SOLN
INTRAMUSCULAR | Status: AC
  Filled 2020-12-25: qty 2

## 2020-12-25 MED ORDER — SODIUM CHLORIDE 0.9 % IV SOLN: INTRAVENOUS | Status: AC

## 2020-12-25 MED ORDER — LABETALOL HCL 5 MG/ML IV SOLN: 10.0000 mg | INTRAVENOUS | Status: AC | PRN

## 2020-12-25 MED ORDER — VERAPAMIL HCL 2.5 MG/ML IV SOLN
INTRAVENOUS | Status: DC | PRN
  Administered 2020-12-25: 2.5 mg via INTRAVENOUS

## 2020-12-25 MED ORDER — LOSARTAN POTASSIUM 25 MG PO TABS
25.0000 mg | ORAL_TABLET | Freq: Every day | ORAL | Status: DC
  Administered 2020-12-25 – 2020-12-27 (×3): 25 mg via ORAL
  Filled 2020-12-25 (×3): qty 1

## 2020-12-25 MED ORDER — ONDANSETRON HCL 4 MG/2ML IJ SOLN: 4.0000 mg | Freq: Four times a day (QID) | INTRAMUSCULAR | Status: DC | PRN

## 2020-12-25 MED ORDER — ASPIRIN 81 MG PO CHEW
81.0000 mg | CHEWABLE_TABLET | Freq: Every day | ORAL | Status: DC
  Administered 2020-12-25 – 2020-12-27 (×3): 81 mg via ORAL
  Filled 2020-12-25 (×3): qty 1

## 2020-12-25 MED ORDER — TICAGRELOR 90 MG PO TABS
ORAL_TABLET | ORAL | Status: AC
  Filled 2020-12-25: qty 2

## 2020-12-25 MED ORDER — MORPHINE SULFATE (PF) 2 MG/ML IV SOLN: 2.0000 mg | Freq: Once | INTRAVENOUS | Status: AC

## 2020-12-25 MED ORDER — TIROFIBAN HCL IN NACL 5-0.9 MG/100ML-% IV SOLN
INTRAVENOUS | Status: DC
  Filled 2020-12-25: qty 100

## 2020-12-25 MED ORDER — CHLORHEXIDINE GLUCONATE CLOTH 2 % EX PADS
6.0000 | MEDICATED_PAD | Freq: Every day | CUTANEOUS | Status: DC
  Administered 2020-12-25 – 2020-12-26 (×2): 6 via TOPICAL

## 2020-12-25 MED ORDER — TIROFIBAN HCL IV 12.5 MG/250 ML
INTRAVENOUS | Status: AC
  Administered 2020-12-25: 12500 ug via INTRAVENOUS
  Administered 2020-12-25: 0.15 ug/kg/min via INTRAVENOUS
  Filled 2020-12-25: qty 250

## 2020-12-25 MED ORDER — TICAGRELOR 90 MG PO TABS
ORAL_TABLET | ORAL | Status: DC | PRN
  Administered 2020-12-25: 180 mg via ORAL

## 2020-12-25 MED ORDER — TIROFIBAN HCL IV 12.5 MG/250 ML
INTRAVENOUS | Status: AC | PRN
  Administered 2020-12-25: 0.15 ug/kg/min via INTRAVENOUS

## 2020-12-25 MED ORDER — SODIUM CHLORIDE 0.9 % IV SOLN: 250.0000 mL | INTRAVENOUS | Status: DC | PRN

## 2020-12-25 MED ORDER — TICAGRELOR 90 MG PO TABS
90.0000 mg | ORAL_TABLET | Freq: Two times a day (BID) | ORAL | Status: DC
  Administered 2020-12-25 – 2020-12-27 (×5): 90 mg via ORAL
  Filled 2020-12-25 (×5): qty 1

## 2020-12-25 MED ORDER — ATORVASTATIN CALCIUM 80 MG PO TABS
80.0000 mg | ORAL_TABLET | Freq: Every day | ORAL | Status: DC
  Administered 2020-12-25 – 2020-12-26 (×2): 80 mg via ORAL
  Filled 2020-12-25: qty 1
  Filled 2020-12-25: qty 4
  Filled 2020-12-25 (×2): qty 1

## 2020-12-25 SURGICAL SUPPLY — 16 items
BALLN TREK RX 2.25X15 (BALLOONS) ×2
BALLN ~~LOC~~ TREK RX 3.0X20 (BALLOONS) ×2
BALLOON TREK RX 2.25X15 (BALLOONS) ×1 IMPLANT
BALLOON ~~LOC~~ TREK RX 3.0X20 (BALLOONS) ×1 IMPLANT
CATH INFINITI 5 FR JL3.5 (CATHETERS) ×2 IMPLANT
CATH INFINITI 5FR ANG PIGTAIL (CATHETERS) ×2 IMPLANT
CATH LAUNCHER 6FR JR4 (CATHETERS) ×2 IMPLANT
DEVICE RAD TR BAND REGULAR (VASCULAR PRODUCTS) ×2 IMPLANT
GLIDESHEATH SLEND SS 6F .021 (SHEATH) ×2 IMPLANT
GUIDEWIRE INQWIRE 1.5J.035X260 (WIRE) ×1 IMPLANT
INQWIRE 1.5J .035X260CM (WIRE) ×2
KIT ENCORE 26 ADVANTAGE (KITS) ×2 IMPLANT
KIT MANI 3VAL PERCEP (MISCELLANEOUS) ×2 IMPLANT
PACK CARDIAC CATH (CUSTOM PROCEDURE TRAY) ×2 IMPLANT
STENT SYNERGY DES 2.5X28 (Permanent Stent) ×2 IMPLANT
WIRE ASAHI PROWATER 180CM (WIRE) ×2 IMPLANT

## 2020-12-25 NOTE — ED Notes (Signed)
Pt reports last meal 2000hrs last fluid intake 2230hrs - xray at bedside performing PCXR

## 2020-12-25 NOTE — ED Notes (Signed)
Crash cart at bedside -- PADS in place -- monitoring initiated on Zoll Lifepak

## 2020-12-25 NOTE — ED Provider Notes (Signed)
[facility] Emergency Department Provider Note   ____________________________________________   Event Date/Time   First MD Initiated Contact with Patient 12/25/20 0000     (approximate)  I have reviewed the triage vital signs and the nursing notes.   HISTORY  Chief Complaint Chest Pain    HPI Ronald Valdez is a 50 y.o. male brought to the ED from home via EMS with a chief complaint of chest pain.  Patient with a history of hypertension and smoking who had some chest pain earlier in the week.  Returned this morning and resolved.  Returned and intensified approximately 10:30 PM with nausea.  Denies associated diaphoresis, vomiting, palpitations or dizziness.  Given 100 mcg fentanyl and 4 mg IV Zofran per EMS.  Patient took 4 baby aspirin prior to arrival.  EMS reports inferior wall STEMI.  Code STEMI activated in the field.  Chest pain improved from 10+/10 down to 7/10 at the time of patient's arrival.   Past Medical History:  Diagnosis Date  . Hypertension   . Retinitis pigmentosa     There are no problems to display for this patient.   Past Surgical History:  Procedure Laterality Date  . HERNIA REPAIR      Prior to Admission medications   Medication Sig Start Date End Date Taking? Authorizing Provider  losartan (COZAAR) 25 MG tablet Take 25 mg by mouth daily.   Yes [provider]  HYDROcodone-acetaminophen (NORCO/VICODIN) 5-325 MG per tablet Take 1 tablet by mouth every 6 (six) hours as needed for moderate pain (Tooth extraction).    [provider]  meloxicam (MOBIC) 15 MG tablet Take 1 tablet (15 mg total) by mouth daily. 06/18/15   Hassan Rowan, MD    Allergies Patient has no known allergies.  Family history + CAD  Social History Social History   Tobacco Use  . Smoking status: Current Every Day Smoker    Packs/day: 1.50  . Smokeless tobacco: Never Used  Substance Use Topics  . Alcohol use: No  . Drug use: No     Review of Systems  Constitutional: No fever/chills Eyes: No visual changes. ENT: No sore throat. Cardiovascular: Positive for chest pain. Respiratory: Denies shortness of breath. Gastrointestinal: No abdominal pain.  Positive for nausea, no vomiting.  No diarrhea.  No constipation. Genitourinary: Negative for dysuria. Musculoskeletal: Negative for back pain. Skin: Negative for rash. Neurological: Negative for headaches, focal weakness or numbness.   ____________________________________________   PHYSICAL EXAM:  VITAL SIGNS: ED Triage Vitals  Enc Vitals Group     BP      Pulse      Resp      Temp      Temp src      SpO2      Weight      Height      Head Circumference      Peak Flow      Pain Score      Pain Loc      Pain Edu?      Excl. in GC?     Constitutional: Alert and oriented. Well appearing and in mild acute distress. Eyes: Conjunctivae are normal. PERRL. EOMI. Head: Atraumatic. Nose: No congestion/rhinnorhea. Mouth/Throat: Mucous membranes are moist.   Neck: No stridor.   Cardiovascular: Normal rate, regular rhythm. Grossly normal heart sounds.  Good peripheral circulation. Respiratory: Normal respiratory effort.  No retractions. Lungs CTAB. Gastrointestinal: Soft and nontender. No distention. No abdominal bruits. No CVA tenderness.  Musculoskeletal: No lower extremity tenderness nor edema.  No joint effusions. Neurologic:  Normal speech and language. No gross focal neurologic deficits are appreciated.  Skin:  Skin is warm, dry and intact. No rash noted. Psychiatric: Mood and affect are normal. Speech and behavior are normal.  ____________________________________________   LABS (all labs ordered are listed, but only abnormal results are displayed)  Labs Reviewed  CBC WITH DIFFERENTIAL/PLATELET - Abnormal; Notable for the following components:      Result Value   WBC 18.0 (*)    Neutro Abs 10.4 (*)    Lymphs Abs 5.6 (*)    Monocytes Absolute  1.4 (*)    Eosinophils Absolute 0.6 (*)    Abs Immature Granulocytes 0.12 (*)    All other components within normal limits  RESP PANEL BY RT-PCR (FLU A&B, COVID) ARPGX2  COMPREHENSIVE METABOLIC PANEL  PROTIME-INR  TROPONIN I (HIGH SENSITIVITY)   ____________________________________________  EKG  ED ECG REPORT I, Kaitlynne Wenz J, the attending physician, personally viewed and interpreted this ECG.   Date: 12/25/2020  EKG Time: 2358  Rate: 67  Rhythm: normal EKG, normal sinus rhythm  Axis: Normal  Intervals:none  ST&T Change: inf STEMI  ____________________________________________  RADIOLOGY I, Bruce Mayers J, personally viewed and evaluated these images (plain radiographs) as part of my medical decision making, as well as reviewing the written report by the radiologist.  ED MD interpretation: No acute cardiopulmonary process  Official radiology report(s): DG Chest Port 1 View  Result Date: 12/25/2020 CLINICAL DATA:  Chest pain. EXAM: PORTABLE CHEST 1 VIEW COMPARISON:  November 25, 2014 FINDINGS: Decreased lung volumes are seen which is likely secondary to the degree of patient inspiration. Multiple radiopaque tags and lead wires are seen with subsequently limited evaluation of the left lung. The right lung is clear. There is no evidence of a pleural effusion or pneumothorax. There is mild to moderate severity enlargement of the cardiac silhouette. The visualized skeletal structures are unremarkable. IMPRESSION: Limited study, as described above, demonstrating no acute or active cardiopulmonary disease. Electronically Signed   By: Aram Candela M.D.   On: 12/25/2020 00:21    ____________________________________________   PROCEDURES  Procedure(s) performed (including Critical Care):  .1-3 Lead EKG Interpretation Performed by: Irean Hong, MD Authorized by: Irean Hong, MD     Interpretation: abnormal     ECG rate:  67   ECG rate assessment: normal     Rhythm: sinus rhythm      Ectopy: none     Conduction: normal   Comments:     [place] morphology c/w STEMI    CRITICAL CARE Performed by: Irean Hong   Total critical care time: 20 minutes  Critical care time was exclusive of separately billable procedures and treating other patients.  Critical care was necessary to treat or prevent imminent or life-threatening deterioration.  Critical care was time spent personally by me on the following activities: development of treatment plan with patient and/or surrogate as well as nursing, discussions with consultants, evaluation of patient's response to treatment, examination of patient, obtaining history from patient or surrogate, ordering and performing treatments and interventions, ordering and review of laboratory studies, ordering and review of radiographic studies, pulse oximetry and re-evaluation of patient's condition.  ____________________________________________   INITIAL IMPRESSION / ASSESSMENT AND PLAN / ED COURSE  As part of my medical decision making, I reviewed the following data within the electronic MEDICAL RECORD NUMBER Nursing notes reviewed and incorporated, Labs reviewed, EKG interpreted, Old chart reviewed,  Radiograph reviewed and Notes from prior ED visits     50 year old male presenting with chest pain. Differential diagnosis includes, but is not limited to, ACS, aortic dissection, pulmonary embolism, cardiac tamponade, pneumothorax, pneumonia, pericarditis, myocarditis, GI-related causes including esophagitis/gastritis, and musculoskeletal chest wall pain.    [facility] activated code STEMI in the field.  On arrival patient's EKG demonstrates inferior STEMI.  Given 2 mg IV morphine for pain with improvement.  Awaiting cardiologist for Cath Lab.  Clinical Course as of 12/25/20 0038  Caleen Essex Dec 25, 2020  0017 Dr. Darrold Junker at bedside evaluating patient for Cath Lab. [JS]  0031 Will notify hospitalist services at Dr. Evette Georges' request.  [JS]    Clinical Course User Index [JS] Irean Hong, MD     ____________________________________________   FINAL CLINICAL IMPRESSION(S) / ED DIAGNOSES  Final diagnoses:  ST elevation myocardial infarction (STEMI), unspecified artery (HCC)  Unstable angina pectoris [facility])     ED Discharge Orders    None      *Please note:  [place] CURRENT was evaluated in Emergency Department on 12/25/2020 for the symptoms described in the history of present illness. He was evaluated in the context of the global COVID-19 pandemic, which necessitated consideration that the patient might be at risk for infection with the SARS-CoV-2 virus that causes COVID-19. Institutional protocols and algorithms that pertain to the evaluation of patients at risk for COVID-19 are in a state of rapid change based on information released by regulatory bodies including the CDC and federal and state organizations. These policies and algorithms were followed during the patient's care in the ED.  Some ED evaluations and interventions may be delayed as a result of limited staffing during and the pandemic.*   Note:  This document was prepared using Dragon voice recognition software and may include unintentional dictation errors.   Irean Hong, MD 12/25/20 [phone]

## 2020-12-25 NOTE — Plan of Care (Signed)
  Problem: Education: Goal: Knowledge of General Education information will improve Description: Including pain rating scale, medication(s)/side effects and non-pharmacologic comfort measures Outcome: Progressing   Problem: Health Behavior/Discharge Planning: Goal: Ability to manage health-related needs will improve Outcome: Progressing   Problem: Clinical Measurements: Goal: Ability to maintain clinical measurements within normal limits will improve Outcome: Progressing Goal: Will remain free from infection Outcome: Progressing Goal: Diagnostic test results will improve Outcome: Progressing Goal: Respiratory complications will improve Outcome: Progressing Goal: Cardiovascular complication will be avoided Outcome: Progressing   Problem: Activity: Goal: Risk for activity intolerance will decrease Outcome: Progressing   Problem: Coping: Goal: Level of anxiety will decrease Outcome: Progressing   Problem: Elimination: Goal: Will not experience complications related to bowel motility Outcome: Progressing   Problem: Pain Managment: Goal: General experience of comfort will improve Outcome: Progressing   

## 2020-12-25 NOTE — Progress Notes (Signed)
*  PRELIMINARY RESULTS* Echocardiogram 2D Echocardiogram has been performed.  Ronald Valdez 12/25/2020, 1:22 PM

## 2020-12-25 NOTE — Consult Note (Signed)
[facility] Cardiology  CARDIOLOGY CONSULT NOTE  Patient ID: Ronald Valdez MRN: [id number] DOB/AGE: [birth year] 50 y.o.  Admit date: 12/24/2020 Referring Physician [facility] Primary Physician  Primary Cardiologist  Reason for Consultation inferior STEMI  HPI: 50 year old gentleman referred for evaluation of inferior STEMI.  Patient has a history of essential hypertension on losartan.  He presents with 3-hour history of substernal chest pain rated 8 out of 10.  ECG reveals ST elevations in the inferior leads consistent with inferior ST elevation myocardial infarction.  Review of systems complete and found to be negative unless listed above     Past Medical History:  Diagnosis Date  . Hypertension   . Retinitis pigmentosa     Past Surgical History:  Procedure Laterality Date  . HERNIA REPAIR      (Not in a hospital admission)  Social History   Socioeconomic History  . Marital status: Married    Spouse name: Not on file  . Number of children: Not on file  . Years of education: Not on file  . Highest education level: Not on file  Occupational History  . Not on file  Tobacco Use  . Smoking status: Current Every Day Smoker    Packs/day: 1.50  . Smokeless tobacco: Never Used  Substance and Sexual Activity  . Alcohol use: No  . Drug use: No  . Sexual activity: Not on file  Other Topics Concern  . Not on file  Social History Narrative  . Not on file   Social Determinants of Health   Financial Resource Strain: Not on file  Food Insecurity: Not on file  Transportation Needs: Not on file  Physical Activity: Not on file  Stress: Not on file  Social Connections: Not on file  Intimate Partner Violence: Not on file    History reviewed. No pertinent family history.    Review of systems complete and found to be negative unless listed above      PHYSICAL EXAM  General: Well developed, well nourished, in no acute distress HEENT:  Normocephalic and atramatic Neck:  No JVD.   Lungs: Clear bilaterally to auscultation and percussion. Heart: HRRR . Normal S1 and S2 without gallops or murmurs.  Abdomen: Bowel sounds are positive, abdomen soft and non-tender  Msk:  Back normal, normal gait. Normal strength and tone for age. Extremities: No clubbing, cyanosis or edema.   Neuro: Alert and oriented X 3. Psych:  Good affect, responds appropriately  Labs:   Lab Results  Component Value Date   WBC 18.0 (H) 12/25/2020   HGB 15.2 12/25/2020   HCT 45.6 12/25/2020   MCV 93.8 12/25/2020   PLT 263 12/25/2020   No results for input(s): NA, K, CL, CO2, BUN, CREATININE, CALCIUM, PROT, BILITOT, ALKPHOS, ALT, AST, GLUCOSE in the last 168 hours.  Invalid input(s): LABALBU No results found for: CKTOTAL, CKMB, CKMBINDEX, TROPONINI No results found for: CHOL No results found for: HDL No results found for: LDLCALC No results found for: TRIG No results found for: CHOLHDL No results found for: LDLDIRECT    Radiology: [facility] Chest Port 1 View  Result Date: 12/25/2020 CLINICAL DATA:  Chest pain. EXAM: PORTABLE CHEST 1 VIEW COMPARISON:  November 25, 2014 FINDINGS: Decreased lung volumes are seen which is likely secondary to the degree of patient inspiration. Multiple radiopaque tags and lead wires are seen with subsequently limited evaluation of the left lung. The right lung is clear. There is no evidence of a pleural effusion or pneumothorax. There is mild  to moderate severity enlargement of the cardiac silhouette. The visualized skeletal structures are unremarkable. IMPRESSION: Limited study, as described above, demonstrating no acute or active cardiopulmonary disease. Electronically Signed   By: Aram Candela M.D.   On: 12/25/2020 00:21    EKG: Normal sinus rhythm with diagnostic ST elevation in leads II, III and aVF  ASSESSMENT AND PLAN:   1.  Inferior ST elevation myocardial infarction 2.  Essential hypertension  Recommendations  1.  Emergent cardiac catheterization and  probable primary PCI  Signed: Marcina Millard MD,PhD, [facility] 12/25/2020, 12:26 AM

## 2020-12-25 NOTE — H&P (Addendum)
[place]   PATIENT NAME: Ronald Valdez    MR#:  [id number]  DATE OF BIRTH:  1971/03/26  DATE OF ADMISSION:  12/24/2020  PRIMARY CARE PHYSICIAN: Patient, No Pcp Per   Patient is coming from: Home  REQUESTING/REFERRING PHYSICIAN: Chiquita Loth, MD CHIEF COMPLAINT:   Chief Complaint  Patient presents with  . Chest Pain    HISTORY OF PRESENT ILLNESS:  Ronald Valdez is a 50 y.o. male with medical history significant for hypertension, ongoing tobacco abuse and retinitis pigmentosa, who presented to the emergency room as a code STEMI.  The patient is having 3-hour period of substernal chest pain graded 10/10 in severity today and it has been going on since early this week and recurred yesterday morning.  Around 10:30 PM chest pain became more severe and with associated  nausea and diaphoresis without vomiting.  He denied any palpitations.  No leg pain or edema recent travels or surgeries.  No cough or wheezing or hemoptysis.  No bleeding diathesis.  He was given 100 mcg of IV fentanyl and 4 mg of IV Zofran by EMS.  The patient took 4 baby aspirin prior to arrival to the ER.  Chest pain improved from 10/10 in severity to 7/10 when he arrived to the ER.  ED Course: When he came to the ER respiratory it was 21 with proximity of 91 and later 97% on room air and otherwise normal vital signs.  Labs revealed significant leukocytosis of 18 with neutrophilia.  INR was 1.1 and PT 13.5.  Respiratory panel is currently pending.  EKG as reviewed by me : showed inferior ST segment elevation of 5 mm and lateral 3 mm ST segment depression especially in V 4 through V6 with reciprocal anteroseptal T wave inversion in V1 and V2. Imaging: Chest x-ray showed no acute cardiopulmonary disease.  The patient was given 2 mg IV morphine sulfate in the ER.  Dr. Darrold Junker was notified about the patient who was was taken to the Cath Lab.  He underwent PCI distal RCA stent placement.  He is admitted to the ICU for  further management. PAST MEDICAL HISTORY:   Past Medical History:  Diagnosis Date  . Hypertension   . Retinitis pigmentosa     PAST SURGICAL HISTORY:   Past Surgical History:  Procedure Laterality Date  . HERNIA REPAIR      SOCIAL HISTORY:   Social History   Tobacco Use  . Smoking status: Current Every Day Smoker    Packs/day: 1.50  . Smokeless tobacco: Never Used  Substance Use Topics  . Alcohol use: No    FAMILY HISTORY:   Positive for MI at age of 16 in his father  who also had hypertension and diabetes.  DRUG ALLERGIES:  No Known Allergies  REVIEW OF SYSTEMS:   ROS As per history of present illness. All pertinent systems were reviewed above. Constitutional, HEENT, cardiovascular, respiratory, GI, GU, musculoskeletal, neuro, psychiatric, endocrine, integumentary and hematologic systems were reviewed and are otherwise negative/unremarkable except for positive findings mentioned above in the HPI.   MEDICATIONS AT HOME:   Prior to Admission medications   Medication Sig Start Date End Date Taking? Authorizing Provider  losartan (COZAAR) 25 MG tablet Take 25 mg by mouth daily.   Yes [provider]  HYDROcodone-acetaminophen (NORCO/VICODIN) 5-325 MG per tablet Take 1 tablet by mouth every 6 (six) hours as needed for moderate pain (Tooth extraction).    [provider]  meloxicam [facility])  15 MG tablet Take 1 tablet (15 mg total) by mouth daily. 06/18/15   Hassan Rowan, MD      VITAL SIGNS:  Blood pressure 135/83, pulse 80, temperature 98.1 F (36.7 C), temperature source Oral, resp. rate (!) 22, SpO2 94 %.  PHYSICAL EXAMINATION:  Physical Exam  GENERAL:  50 y.o.-year-old obese Caucasian patient lying in the bed with no acute distress.  EYES: Pupils equal, round, reactive to light and accommodation. No scleral icterus. Extraocular muscles intact.  HEENT: Head atraumatic, normocephalic. Oropharynx and nasopharynx clear.  NECK:  Supple, no jugular  venous distention. No thyroid enlargement, no tenderness.  LUNGS: Normal breath sounds bilaterally, no wheezing, rales,rhonchi or crepitation. No use of accessory muscles of respiration.  CARDIOVASCULAR: Regular rate and rhythm, S1, S2 normal. No murmurs, rubs, or gallops.  ABDOMEN: Soft, nondistended, nontender. Bowel sounds present. No organomegaly or mass.  EXTREMITIES: No pedal edema, cyanosis, or clubbing.  NEUROLOGIC: Cranial nerves II through XII are intact. Muscle strength 5/5 in all extremities. Sensation intact. Gait not checked.  PSYCHIATRIC: The patient is alert and oriented x 3.  Normal affect and good eye contact. SKIN: No obvious rash, lesion, or ulcer.   LABORATORY PANEL:   CBC Recent Labs  Lab 12/25/20 0006  WBC 18.0*  HGB 15.2  HCT 45.6  PLT 263   ------------------------------------------------------------------------------------------------------------------  Chemistries  No results for input(s): NA, K, CL, CO2, GLUCOSE, BUN, CREATININE, CALCIUM, MG, AST, ALT, ALKPHOS, BILITOT in the last 168 hours.  Invalid input(s): GFRCGP ------------------------------------------------------------------------------------------------------------------  Cardiac Enzymes No results for input(s): TROPONINI in the last 168 hours. ------------------------------------------------------------------------------------------------------------------  RADIOLOGY:  DG Chest Port 1 View  Result Date: 12/25/2020 CLINICAL DATA:  Chest pain. EXAM: PORTABLE CHEST 1 VIEW COMPARISON:  November 25, 2014 FINDINGS: Decreased lung volumes are seen which is likely secondary to the degree of patient inspiration. Multiple radiopaque tags and lead wires are seen with subsequently limited evaluation of the left lung. The right lung is clear. There is no evidence of a pleural effusion or pneumothorax. There is mild to moderate severity enlargement of the cardiac silhouette. The visualized skeletal structures  are unremarkable. IMPRESSION: Limited study, as described above, demonstrating no acute or active cardiopulmonary disease. Electronically Signed   By: Aram Candela M.D.   On: 12/25/2020 00:21      IMPRESSION AND PLAN:  Active Problems:   STEMI (ST elevation myocardial infarction) (HCC)  1.  Acute inferior ST segment elevation MI, status post PCI and distal RCA stent placement. -The patient is admitted to the ICU. -He will be placed on aspirin and Brilinta. -High-dose statin therapy will be provided as well as beta-blocker therapy. -He will be on as needed sublingual nitroglycerin and morphine sulfate for pain. -The patient will be on IV Aggrastat. -Dr. Darrold Junker will be following with [place].  2.  Essential hypertension. -We will continue his ARB.  3.  Ongoing tobacco abuse. -I counseled him for smoking cessation and he will receive further counseling here.  4.  DVT prophylaxis. -Subcutaneous Lovenox.   DVT prophylaxis: Lovenox. Code Status: full code. Family Communication:  The plan of care was discussed in details with the patient requested no family communication at this time. I answered all questions. The patient agreed to proceed with the above mentioned plan. Further management will depend upon hospital course. Disposition Plan: Back to previous home environment Consults called: Cardiology consult Dr. Darrold Junker. All the records are reviewed and case discussed with ED provider.  Status is: Inpatient  Remains  inpatient appropriate because:Ongoing active pain requiring inpatient pain management, Ongoing diagnostic testing needed not appropriate for outpatient work up, Unsafe d/c plan, IV treatments appropriate due to intensity of illness or inability to take PO and Inpatient level of care appropriate due to severity of illness   Dispo: The patient is from: Home              Anticipated d/c is to: Home              Patient currently is not medically stable to d/c.    Difficult to place patient No   TOTAL TIME TAKING CARE OF THIS PATIENT: 50 minutes.    Hannah Beat M.D on 12/25/2020 at 12:46 AM  [facility]   From 7 PM-7 AM, contact night-coverage [email or web address]  CC: Primary care physician; Patient, No Pcp Per

## 2020-12-25 NOTE — Progress Notes (Signed)
Dr Darrold Junker verbal order of 0.15 mcg/kg/min tirofiban X 18 hours. Normal saline 100 ml/hour x 10 hours. May start removing air from TR band as per protocol.

## 2020-12-25 NOTE — ED Notes (Signed)
Wife at bedside.

## 2020-12-25 NOTE — Progress Notes (Signed)
[facility] Cardiology  SUBJECTIVE: Patient laying in bed, reports mild residual chest pain, much less severe compared to STEMI chest pain   Vitals:   12/25/20 0600 12/25/20 0633 12/25/20 0700 12/25/20 0800  BP: 101/66 101/66 124/77 123/75  Pulse: 73 65 (!) 59 68  Resp: 17 17 16  (!) 21  Temp: 98.2 F (36.8 C) 98.2 F (36.8 C)    TempSrc:  Oral    SpO2: 99%  99% 99%  Weight:  105.4 kg    Height:  5' 8.9" (1.75 m)       Intake/Output Summary (Last 24 hours) at 12/25/2020 02/24/2021 Last data filed at 12/25/2020 0600 Gross per 24 hour  Intake 808.51 ml  Output 750 ml  Net 58.51 ml      PHYSICAL EXAM  General: Well developed, well nourished, in no acute distress HEENT:  Normocephalic and atramatic Neck:  No JVD.  Lungs: Clear bilaterally to auscultation and percussion. Heart: HRRR . Normal S1 and S2 without gallops or murmurs.  Abdomen: Bowel sounds are positive, abdomen soft and non-tender  Msk:  Back normal, normal gait. Normal strength and tone for age. Extremities: No clubbing, cyanosis or edema.   Neuro: Alert and oriented X 3. Psych:  Good affect, responds appropriately   LABS: Basic Metabolic Panel: Recent Labs    12/25/20 0006  NA 139  K 3.1*  CL 105  CO2 24  GLUCOSE 194*  BUN 13  CREATININE 1.07  CALCIUM 8.8*   Liver Function Tests: Recent Labs    12/25/20 0006  AST 31  ALT 31  ALKPHOS 65  BILITOT 0.6  PROT 7.2  ALBUMIN 4.1   No results for input(s): LIPASE, AMYLASE in the last 72 hours. CBC: Recent Labs    12/25/20 0006 12/25/20 0230  WBC 18.0* 15.9*  NEUTROABS 10.4*  --   HGB 15.2 14.6  HCT 45.6 43.5  MCV 93.8 93.3  PLT 263 234   Cardiac Enzymes: No results for input(s): CKTOTAL, CKMB, CKMBINDEX, TROPONINI in the last 72 hours. BNP: Invalid input(s): POCBNP D-Dimer: No results for input(s): DDIMER in the last 72 hours. Hemoglobin A1C: No results for input(s): HGBA1C in the last 72 hours. Fasting Lipid Panel: No results for input(s): CHOL,  HDL, LDLCALC, TRIG, CHOLHDL, LDLDIRECT in the last 72 hours. Thyroid Function Tests: No results for input(s): TSH, T4TOTAL, T3FREE, THYROIDAB in the last 72 hours.  Invalid input(s): FREET3 Anemia Panel: No results for input(s): VITAMINB12, FOLATE, FERRITIN, TIBC, IRON, RETICCTPCT in the last 72 hours.  CARDIAC CATHETERIZATION  Result Date: 12/25/2020  Dist LAD lesion is 30% stenosed.  Dist RCA lesion is 100% stenosed.  A drug-eluting stent was successfully placed using a STENT SYNERGY DES 2.5X28.  Post intervention, there is a 0% residual stenosis.  1.  Inferior STEMI 2.  Acute occlusion distal RCA 3.  Mildly reduced left ventricular function 4.  Successful primary PCI with 2.5 x 28 mm drug-eluting stent distal RCA with mild distal embolization Recommendations 1.  Dual antiplatelet therapy uninterrupted for 1 year 2.  Aggrastat infusion for 18 hours 3.  Resume losartan 25 mg daily 4.  Start metoprolol tartrate 12.5 mg twice daily 5.  Start high intensity atorvastatin 80 mg daily 6.  2D echocardiogram   DG Chest Port 1 View  Result Date: 12/25/2020 CLINICAL DATA:  Chest pain. EXAM: PORTABLE CHEST 1 VIEW COMPARISON:  November 25, 2014 FINDINGS: Decreased lung volumes are seen which is likely secondary to the degree of patient inspiration. Multiple  radiopaque tags and lead wires are seen with subsequently limited evaluation of the left lung. The right lung is clear. There is no evidence of a pleural effusion or pneumothorax. There is mild to moderate severity enlargement of the cardiac silhouette. The visualized skeletal structures are unremarkable. IMPRESSION: Limited study, as described above, demonstrating no acute or active cardiopulmonary disease. Electronically Signed   By: Aram Candela M.D.   On: 12/25/2020 00:21     Echo pending  TELEMETRY: Sinus rhythm:  ASSESSMENT AND PLAN:  Active Problems:   STEMI (ST elevation myocardial infarction) (HCC)   STEMI involving oth coronary  artery of inferior wall (HCC)    1.  Inferior STEMI, as post primary PCI with 2.5 x 28 mm Synergy drug-eluting stent distal RCA with mild distal embolization on Aggrastat infusion 2.  Essential hypertension, blood pressure well controlled  Recommendations  1.  Dual antiplatelet therapy uninterrupted for 1 year 2.  Continue Aggrastat infusion for a total of 18 hours 3.  Continue metoprolol tartrate and losartan 4.  Continue high intensity atorvastatin 5.  Review 2D echocardiogram 6.  May transfer patient to PCU later today   Marcina Millard, MD, PhD, [facility] 12/25/2020 8:11 AM

## 2020-12-25 NOTE — ED Triage Notes (Signed)
Pt arrives from home via [facility] with c/o sudden onset cp that began around 2230hrs tonite - described as crushing with radiating to LUE- denies any other associated symptoms - h/o HTN.  GLO756 -- SBP enroute 118-140- all other VSS.   20g L AC placed prior to arrival; 500cc NS, Fentanyl and 4mg  Zofran subsequently administered.

## 2020-12-25 NOTE — Progress Notes (Signed)
Patient ID: Elease Hashimoto, male   DOB: 06-24-1971, 50 y.o.   MRN: [id number] This is a no charge note as patient was admitted this a.m. Patient seen and examined.  H&P reviewed Ronald Valdez is a 50 y.o. male with medical history significant for hypertension, ongoing tobacco abuse and retinitis pigmentosa, who presented to the emergency room as a code STEMI.  The patient is having 3-hour period of substernal chest pain graded 10/10 in severity today and it has been going on since early this week and recurred yesterday morning.   Patient is status post cardiac cath and underwent PCI to distal RCA.  Currently chest pain-free vss cta  Regular s1/s2 Soft benign +bs No edema  A/P STEMI- cards following  Dual antiplatelet therapy uninterrupted for 1 year  Continue Aggrastat infusion for a total of 18 hours Continue metoprolol tartrate and losartan Echo pending

## 2020-12-25 NOTE — ED Notes (Signed)
Cardiologist at bedside (Dr Leandrew Koyanagi)

## 2020-12-26 DIAGNOSIS — E876 Hypokalemia: Secondary | ICD-10-CM

## 2020-12-26 LAB — BASIC METABOLIC PANEL
Anion gap: 7 (ref 5–15)
BUN: 11 mg/dL (ref 6–20)
CO2: 27 mmol/L (ref 22–32)
Calcium: 9.1 mg/dL (ref 8.9–10.3)
Chloride: 104 mmol/L (ref 98–111)
Creatinine, Ser: 0.76 mg/dL (ref 0.61–1.24)
GFR, Estimated: >60 mL/min (ref >60)
Glucose, Bld: 119 mg/dL — ABNORMAL HIGH (ref 70–99)
Potassium: 3.3 mmol/L — ABNORMAL LOW (ref 3.5–5.1)
Sodium: 138 mmol/L (ref 135–145)

## 2020-12-26 MED ORDER — POTASSIUM CHLORIDE CRYS ER 20 MEQ PO TBCR
40.0000 meq | EXTENDED_RELEASE_TABLET | Freq: Once | ORAL | Status: AC
  Administered 2020-12-26: 40 meq via ORAL
  Filled 2020-12-26: qty 2

## 2020-12-26 MED ORDER — ENOXAPARIN SODIUM 40 MG/0.4ML ~~LOC~~ SOLN: 40.0000 mg | SUBCUTANEOUS | Status: DC

## 2020-12-26 NOTE — Progress Notes (Signed)
[facility] Cardiology  SUBJECTIVE: Patient laying in bed, denies chest pain or shortness of breath   Vitals:   12/25/20 1100 12/25/20 1200 12/25/20 1300 12/26/20 0000  BP: 116/73 99/63 113/69 (!) 98/58  Pulse: (!) 58 (!) 59 (!) 54 (!) 56  Resp: 18 (!) 21 17 20   Temp:      TempSrc:      SpO2: 100% 99% 99% 100%  Weight:      Height:         Intake/Output Summary (Last 24 hours) at 12/26/2020 0956 Last data filed at 12/26/2020 0044 Gross per 24 hour  Intake 222.42 ml  Output 600 ml  Net -377.58 ml      PHYSICAL EXAM  General: Well developed, well nourished, in no acute distress HEENT:  Normocephalic and atramatic Neck:  No JVD.  Lungs: Clear bilaterally to auscultation and percussion. Heart: HRRR . Normal S1 and S2 without gallops or murmurs.  Abdomen: Bowel sounds are positive, abdomen soft and non-tender  Msk:  Back normal, normal gait. Normal strength and tone for age. Extremities: No clubbing, cyanosis or edema.   Neuro: Alert and oriented X 3. Psych:  Good affect, responds appropriately   LABS: Basic Metabolic Panel: Recent Labs    12/25/20 0006  NA 139  K 3.1*  CL 105  CO2 24  GLUCOSE 194*  BUN 13  CREATININE 1.07  CALCIUM 8.8*   Liver Function Tests: Recent Labs    12/25/20 0006  AST 31  ALT 31  ALKPHOS 65  BILITOT 0.6  PROT 7.2  ALBUMIN 4.1   No results for input(s): LIPASE, AMYLASE in the last 72 hours. CBC: Recent Labs    12/25/20 0006 12/25/20 0230  WBC 18.0* 15.9*  NEUTROABS 10.4*  --   HGB 15.2 14.6  HCT 45.6 43.5  MCV 93.8 93.3  PLT 263 234   Cardiac Enzymes: No results for input(s): CKTOTAL, CKMB, CKMBINDEX, TROPONINI in the last 72 hours. BNP: Invalid input(s): POCBNP D-Dimer: No results for input(s): DDIMER in the last 72 hours. Hemoglobin A1C: No results for input(s): HGBA1C in the last 72 hours. Fasting Lipid Panel: No results for input(s): CHOL, HDL, LDLCALC, TRIG, CHOLHDL, LDLDIRECT in the last 72 hours. Thyroid Function  Tests: No results for input(s): TSH, T4TOTAL, T3FREE, THYROIDAB in the last 72 hours.  Invalid input(s): FREET3 Anemia Panel: No results for input(s): VITAMINB12, FOLATE, FERRITIN, TIBC, IRON, RETICCTPCT in the last 72 hours.  CARDIAC CATHETERIZATION  Result Date: 12/25/2020  Dist LAD lesion is 30% stenosed.  Dist RCA lesion is 100% stenosed.  A drug-eluting stent was successfully placed using a STENT SYNERGY DES 2.5X28.  Post intervention, there is a 0% residual stenosis.  1.  Inferior STEMI 2.  Acute occlusion distal RCA 3.  Mildly reduced left ventricular function 4.  Successful primary PCI with 2.5 x 28 mm drug-eluting stent distal RCA with mild distal embolization Recommendations 1.  Dual antiplatelet therapy uninterrupted for 1 year 2.  Aggrastat infusion for 18 hours 3.  Resume losartan 25 mg daily 4.  Start metoprolol tartrate 12.5 mg twice daily 5.  Start high intensity atorvastatin 80 mg daily 6.  2D echocardiogram   DG Chest Port 1 View  Result Date: 12/25/2020 CLINICAL DATA:  Chest pain. EXAM: PORTABLE CHEST 1 VIEW COMPARISON:  November 25, 2014 FINDINGS: Decreased lung volumes are seen which is likely secondary to the degree of patient inspiration. Multiple radiopaque tags and lead wires are seen with subsequently limited evaluation of  the left lung. The right lung is clear. There is no evidence of a pleural effusion or pneumothorax. There is mild to moderate severity enlargement of the cardiac silhouette. The visualized skeletal structures are unremarkable. IMPRESSION: Limited study, as described above, demonstrating no acute or active cardiopulmonary disease. Electronically Signed   By: Aram Candela M.D.   On: 12/25/2020 00:21   ECHOCARDIOGRAM COMPLETE  Result Date: 12/25/2020    ECHOCARDIOGRAM REPORT   Patient Name:   Ronald Valdez Date of Exam: 12/25/2020 Medical Rec #:  [id number]        Height:       68.9 in Accession #:    [id number]       Weight:       232.4 lb Date of  Birth:  1971-01-13        BSA:          2.199 m Patient Age:    49 years         BP:           99/63 mmHg Patient Gender: M                HR:           59 bpm. Exam Location:  [facility] Procedure: 2D Echo, Cardiac Doppler, Color Doppler and Strain Analysis Indications:     Acute myocardial infarction unspecified I21.9  History:         Patient has no prior history of Echocardiogram examinations.                  Risk Factors:Hypertension.  Sonographer:     Cristela Blue RDCS (AE) Referring Phys:  [id number] Lyn Hollingshead Jordell Outten Diagnosing Phys: Marcina Millard MD  Sonographer Comments: Technically challenging study due to limited acoustic windows and suboptimal apical window. IMPRESSIONS  1. Left ventricular ejection fraction, by estimation, is 60 to 65%. The left ventricle has normal function. The left ventricle has no regional wall motion abnormalities. Left ventricular diastolic parameters were normal.  2. Right ventricular systolic function is normal. The right ventricular size is normal.  3. The mitral valve is normal in structure. Mild mitral valve regurgitation. No evidence of mitral stenosis.  4. The aortic valve is normal in structure. Aortic valve regurgitation is not visualized. No aortic stenosis is present.  5. The inferior vena cava is normal in size with greater than 50% respiratory variability, suggesting right atrial pressure of 3 mmHg. FINDINGS  Left Ventricle: Left ventricular ejection fraction, by estimation, is 60 to 65%. The left ventricle has normal function. The left ventricle has no regional wall motion abnormalities. The left ventricular internal cavity size was normal in size. There is  no left ventricular hypertrophy. Left ventricular diastolic parameters were normal. Right Ventricle: The right ventricular size is normal. No increase in right ventricular wall thickness. Right ventricular systolic function is normal. Left Atrium: Left atrial size was normal in size. Right Atrium: Right atrial size  was normal in size. Pericardium: There is no evidence of pericardial effusion. Mitral Valve: The mitral valve is normal in structure. Mild mitral valve regurgitation. No evidence of mitral valve stenosis. Tricuspid Valve: The tricuspid valve is normal in structure. Tricuspid valve regurgitation is mild . No evidence of tricuspid stenosis. Aortic Valve: The aortic valve is normal in structure. Aortic valve regurgitation is not visualized. No aortic stenosis is present. Aortic valve mean gradient measures 3.0 mmHg. Aortic valve peak gradient measures 6.2 mmHg. Aortic valve area, by VTI measures 2.37  cm. Pulmonic Valve: The pulmonic valve was normal in structure. Pulmonic valve regurgitation is not visualized. No evidence of pulmonic stenosis. Aorta: The aortic root is normal in size and structure. Venous: The inferior vena cava is normal in size with greater than 50% respiratory variability, suggesting right atrial pressure of 3 mmHg. IAS/Shunts: No atrial level shunt detected by color flow Doppler.  LEFT VENTRICLE PLAX 2D LVIDd:         4.38 cm  Diastology LVIDs:         2.73 cm  LV e' medial:    5.55 cm/s LV PW:         1.46 cm  LV E/e' medial:  17.3 LV IVS:        0.87 cm  LV e' lateral:   9.36 cm/s LVOT diam:     2.10 cm  LV E/e' lateral: 10.3 LV SV:         53 LV SV Index:   24 LVOT Area:     3.46 cm  RIGHT VENTRICLE RV Basal diam:  2.83 cm RV S prime:     11.20 cm/s TAPSE (M-mode): 3.7 cm LEFT ATRIUM           Index       RIGHT ATRIUM           Index LA diam:      4.30 cm 1.96 cm/m  RA Area:     16.90 cm LA Vol (A4C): 42.2 ml 19.19 ml/m RA Volume:   43.10 ml  19.60 ml/m  AORTIC VALVE                   PULMONIC VALVE AV Area (Vmax):    1.93 cm    PV Vmax:        0.76 m/s AV Area (Vmean):   2.05 cm    PV Peak grad:   2.3 mmHg AV Area (VTI):     2.37 cm    RVOT Peak grad: 4 mmHg AV Vmax:           125.00 cm/s AV Vmean:          83.500 cm/s AV VTI:            0.222 m AV Peak Grad:      6.2 mmHg AV Mean  Grad:      3.0 mmHg LVOT Vmax:         69.80 cm/s LVOT Vmean:        49.400 cm/s LVOT VTI:          0.152 m LVOT/AV VTI ratio: 0.68  AORTA Ao Root diam: 2.87 cm MITRAL VALVE               TRICUSPID VALVE MV Area (PHT): 4.74 cm    TR Peak grad:   15.7 mmHg MV Decel Time: 160 msec    TR Vmax:        198.00 cm/s MV E velocity: 96.00 cm/s MV A velocity: 77.10 cm/s  SHUNTS MV E/A ratio:  1.25        Systemic VTI:  0.15 m                            Systemic Diam: 2.10 cm Marcina Millard MD Electronically signed by Marcina Millard MD Signature Date/Time: 12/25/2020/3:09:42 PM    Final      Echo LVEF 60 to 65%  TELEMETRY: Sinus rhythm:  ASSESSMENT  AND PLAN:  Active Problems:   STEMI (ST elevation myocardial infarction) (HCC)   STEMI involving oth coronary artery of inferior wall (HCC)    1. Inferior STEMI, status post primary PCI with 2.5 x 28 mm Synergy drug-eluting stent distal RCA with mild distal embolization treated with Aggrastat infusion, currently chest pain-free, with preserved left ventricular function by 2D echocardiogram 2.  Essential hypertension, blood pressure well controlled on metoprolol tartrate and losartan  Recommendations  1. Continue dual antiplatelet therapy uninterrupted for 1 year 2. Continue metoprolol tartrate and losartan 3. Continue high intensity atorvastatin 4. Transfer to PCU today 5. Ambulate 6. Discharge in a.m. 7. Follow-up as outpatient in 1 to 2 weeks    Marcina Millard, MD, PhD, [facility] 12/26/2020 9:56 AM

## 2020-12-26 NOTE — Progress Notes (Signed)
PROGRESS NOTE    Ronald Valdez  KAJ:[id number] DOB: 05-Aug-1971 DOA: 12/24/2020 PCP: Patient, No Pcp Per    Brief Narrative:  Ronald Morita Shoemakeris a 49 y.o.malewith medical history significant forhypertension,ongoingtobacco abuse and retinitis pigmentosa, who presented to the emergency room as a code STEMI. The patient is having 3-hour period of substernal chest pain graded 10/10 in severity today and it has been going on since early this week and recurred yesterday morning.   Patient is status post cardiac cath and underwent PCI to distal RCA.  3/5-no overnight events  Consultants:   Cardiology  Procedures: Status post cath  Antimicrobials:       Subjective: Chest pain-free, no shortness of breath  Objective: Vitals:   12/26/20 0000 12/26/20 0800 12/26/20 0900 12/26/20 1000  BP: (!) 98/58  118/72 111/74  Pulse: (!) 56 66 64 68  Resp: 20 (!) 22 (!) 23 (!) 25  Temp:      TempSrc:      SpO2: 100% 99% 99% 100%  Weight:      Height:        Intake/Output Summary (Last 24 hours) at 12/26/2020 1601 Last data filed at 12/26/2020 1317 Gross per 24 hour  Intake 222.42 ml  Output 3000 ml  Net -2777.58 ml   Filed Weights   12/25/20 0633  Weight: 105.4 kg    Examination:  General exam: Appears calm and comfortable  Respiratory system: Clear to auscultation. Respiratory effort normal. Cardiovascular system: S1 & S2 heard, RRR. No JVD, murmurs, rubs, gallops or clicks.  Gastrointestinal system: Abdomen is nondistended, soft and nontender.  Normal bowel sounds heard. Central nervous system: Alert and oriented. No focal neurological deficits. Extremities: No edema Skin: Warm dry Psychiatry: Judgement and insight appear normal. Mood & affect appropriate.     Data Reviewed: I have personally reviewed following labs and imaging studies  CBC: Recent Labs  Lab 12/25/20 0006 12/25/20 0230  WBC 18.0* 15.9*  NEUTROABS 10.4*  --   HGB 15.2 14.6  HCT 45.6 43.5   MCV 93.8 93.3  PLT 263 234   Basic Metabolic Panel: Recent Labs  Lab 12/25/20 0006 12/26/20 0953  NA 139 138  K 3.1* 3.3*  CL 105 104  CO2 24 27  GLUCOSE 194* 119*  BUN 13 11  CREATININE 1.07 0.76  CALCIUM 8.8* 9.1   GFR: Estimated Creatinine Clearance: 132 mL/min (by C-G formula based on SCr of 0.76 mg/dL). Liver Function Tests: Recent Labs  Lab 12/25/20 0006  AST 31  ALT 31  ALKPHOS 65  BILITOT 0.6  PROT 7.2  ALBUMIN 4.1   No results for input(s): LIPASE, AMYLASE in the last 168 hours. No results for input(s): AMMONIA in the last 168 hours. Coagulation Profile: Recent Labs  Lab 12/25/20 0006  INR 1.1   Cardiac Enzymes: No results for input(s): CKTOTAL, CKMB, CKMBINDEX, TROPONINI in the last 168 hours. BNP (last 3 results) No results for input(s): PROBNP in the last 8760 hours. HbA1C: No results for input(s): HGBA1C in the last 72 hours. CBG: Recent Labs  Lab 12/25/20 0200  GLUCAP 144*   Lipid Profile: No results for input(s): CHOL, HDL, LDLCALC, TRIG, CHOLHDL, LDLDIRECT in the last 72 hours. Thyroid Function Tests: No results for input(s): TSH, T4TOTAL, FREET4, T3FREE, THYROIDAB in the last 72 hours. Anemia Panel: No results for input(s): VITAMINB12, FOLATE, FERRITIN, TIBC, IRON, RETICCTPCT in the last 72 hours. Sepsis Labs: No results for input(s): PROCALCITON, LATICACIDVEN in the last 168 hours.  Recent Results (from the past 240 hour(s))  Resp Panel by RT-PCR (Flu A&B, Covid) Nasopharyngeal Swab     Status: None   Collection Time: 12/25/20 12:06 AM   Specimen: Nasopharyngeal Swab; Nasopharyngeal(NP) swabs in vial transport medium  Result Value Ref Range Status   SARS Coronavirus 2 by RT PCR NEGATIVE NEGATIVE Final    Comment: (NOTE) SARS-CoV-2 target nucleic acids are NOT DETECTED.  The SARS-CoV-2 RNA is generally detectable in upper respiratory specimens during the acute phase of infection. The lowest concentration of SARS-CoV-2 viral  copies this assay can detect is 138 copies/mL. A negative result does not preclude SARS-Cov-2 infection and should not be used as the sole basis for treatment or other patient management decisions. A negative result may occur with  improper specimen collection/handling, submission of specimen other than nasopharyngeal swab, presence of viral mutation(s) within the areas targeted by this assay, and inadequate number of viral copies(<138 copies/mL). A negative result must be combined with clinical observations, patient history, and epidemiological information. The expected result is Negative.  Fact Sheet for Patients:  BloggerCourse.com  Fact Sheet for Healthcare Providers:  SeriousBroker.it  This test is no t yet approved or cleared by the [place] FDA and  has been authorized for detection and/or diagnosis of SARS-CoV-2 by FDA under an Emergency Use Authorization (EUA). This EUA will remain  in effect (meaning this test can be used) for the duration of the COVID-19 declaration under Section 564(b)(1) of the Act, 21 U.S.C.section 360bbb-3(b)(1), unless the authorization is terminated  or revoked sooner.       Influenza A by PCR NEGATIVE NEGATIVE Final   Influenza B by PCR NEGATIVE NEGATIVE Final    Comment: (NOTE) The Xpert Xpress SARS-CoV-2/FLU/RSV plus assay is intended as an aid in the diagnosis of influenza from Nasopharyngeal swab specimens and should not be used as a sole basis for treatment. Nasal washings and aspirates are unacceptable for Xpert Xpress SARS-CoV-2/FLU/RSV testing.  Fact Sheet for Patients: BloggerCourse.com  Fact Sheet for Healthcare Providers: SeriousBroker.it  This test is not yet approved or cleared by the [place] FDA and has been authorized for detection and/or diagnosis of SARS-CoV-2 by FDA under an Emergency Use Authorization (EUA). This  EUA will remain in effect (meaning this test can be used) for the duration of the COVID-19 declaration under Section 564(b)(1) of the Act, 21 U.S.C. section 360bbb-3(b)(1), unless the authorization is terminated or revoked.  Performed at [facility], [address] [place], [place] [address]   MRSA PCR Screening     Status: None   Collection Time: 12/25/20  2:13 AM   Specimen: Nasal Mucosa; Nasopharyngeal  Result Value Ref Range Status   MRSA by PCR NEGATIVE NEGATIVE Final    Comment:        The GeneXpert MRSA Assay (FDA approved for NASAL specimens only), is one component of a comprehensive MRSA colonization surveillance program. It is not intended to diagnose MRSA infection nor to guide or monitor treatment for MRSA infections. Performed at [facility], [address], [place], [place] [address]          Radiology Studies: CARDIAC CATHETERIZATION  Result Date: 12/25/2020  Dist LAD lesion is 30% stenosed.  Dist RCA lesion is 100% stenosed.  A drug-eluting stent was successfully placed using a STENT SYNERGY DES 2.5X28.  Post intervention, there is a 0% residual stenosis.  1.  Inferior STEMI 2.  Acute occlusion distal RCA 3.  Mildly reduced left ventricular  function 4.  Successful primary PCI with 2.5 x 28 mm drug-eluting stent distal RCA with mild distal embolization Recommendations 1.  Dual antiplatelet therapy uninterrupted for 1 year 2.  Aggrastat infusion for 18 hours 3.  Resume losartan 25 mg daily 4.  Start metoprolol tartrate 12.5 mg twice daily 5.  Start high intensity atorvastatin 80 mg daily 6.  2D echocardiogram   DG Chest Port 1 View  Result Date: 12/25/2020 CLINICAL DATA:  Chest pain. EXAM: PORTABLE CHEST 1 VIEW COMPARISON:  November 25, 2014 FINDINGS: Decreased lung volumes are seen which is likely secondary to the degree of patient inspiration. Multiple radiopaque tags and lead wires are seen with subsequently limited evaluation of the  left lung. The right lung is clear. There is no evidence of a pleural effusion or pneumothorax. There is mild to moderate severity enlargement of the cardiac silhouette. The visualized skeletal structures are unremarkable. IMPRESSION: Limited study, as described above, demonstrating no acute or active cardiopulmonary disease. Electronically Signed   By: Aram Candela M.D.   On: 12/25/2020 00:21   ECHOCARDIOGRAM COMPLETE  Result Date: 12/25/2020    ECHOCARDIOGRAM REPORT   Patient Name:   Ronald Valdez Date of Exam: 12/25/2020 Medical Rec #:  [id number]        Height:       68.9 in Accession #:    [id number]       Weight:       232.4 lb Date of Birth:  July 03, 1971        BSA:          2.199 m Patient Age:    49 years         BP:           99/63 mmHg Patient Gender: M                HR:           59 bpm. Exam Location:  [facility] Procedure: 2D Echo, Cardiac Doppler, Color Doppler and Strain Analysis Indications:     Acute myocardial infarction unspecified I21.9  History:         Patient has no prior history of Echocardiogram examinations.                  Risk Factors:Hypertension.  Sonographer:     Cristela Blue RDCS (AE) Referring Phys:  [id number] Lyn Hollingshead PARASCHOS Diagnosing Phys: Marcina Millard MD  Sonographer Comments: Technically challenging study due to limited acoustic windows and suboptimal apical window. IMPRESSIONS  1. Left ventricular ejection fraction, by estimation, is 60 to 65%. The left ventricle has normal function. The left ventricle has no regional wall motion abnormalities. Left ventricular diastolic parameters were normal.  2. Right ventricular systolic function is normal. The right ventricular size is normal.  3. The mitral valve is normal in structure. Mild mitral valve regurgitation. No evidence of mitral stenosis.  4. The aortic valve is normal in structure. Aortic valve regurgitation is not visualized. No aortic stenosis is present.  5. The inferior vena cava is normal in size with greater  than 50% respiratory variability, suggesting right atrial pressure of 3 mmHg. FINDINGS  Left Ventricle: Left ventricular ejection fraction, by estimation, is 60 to 65%. The left ventricle has normal function. The left ventricle has no regional wall motion abnormalities. The left ventricular internal cavity size was normal in size. There is  no left ventricular hypertrophy. Left ventricular diastolic parameters were normal. Right Ventricle: The right ventricular size is  normal. No increase in right ventricular wall thickness. Right ventricular systolic function is normal. Left Atrium: Left atrial size was normal in size. Right Atrium: Right atrial size was normal in size. Pericardium: There is no evidence of pericardial effusion. Mitral Valve: The mitral valve is normal in structure. Mild mitral valve regurgitation. No evidence of mitral valve stenosis. Tricuspid Valve: The tricuspid valve is normal in structure. Tricuspid valve regurgitation is mild . No evidence of tricuspid stenosis. Aortic Valve: The aortic valve is normal in structure. Aortic valve regurgitation is not visualized. No aortic stenosis is present. Aortic valve mean gradient measures 3.0 mmHg. Aortic valve peak gradient measures 6.2 mmHg. Aortic valve area, by VTI measures 2.37 cm. Pulmonic Valve: The pulmonic valve was normal in structure. Pulmonic valve regurgitation is not visualized. No evidence of pulmonic stenosis. Aorta: The aortic root is normal in size and structure. Venous: The inferior vena cava is normal in size with greater than 50% respiratory variability, suggesting right atrial pressure of 3 mmHg. IAS/Shunts: No atrial level shunt detected by color flow Doppler.  LEFT VENTRICLE PLAX 2D LVIDd:         4.38 cm  Diastology LVIDs:         2.73 cm  LV e' medial:    5.55 cm/s LV PW:         1.46 cm  LV E/e' medial:  17.3 LV IVS:        0.87 cm  LV e' lateral:   9.36 cm/s LVOT diam:     2.10 cm  LV E/e' lateral: 10.3 LV SV:         53 LV  SV Index:   24 LVOT Area:     3.46 cm  RIGHT VENTRICLE RV Basal diam:  2.83 cm RV S prime:     11.20 cm/s TAPSE (M-mode): 3.7 cm LEFT ATRIUM           Index       RIGHT ATRIUM           Index LA diam:      4.30 cm 1.96 cm/m  RA Area:     16.90 cm LA Vol (A4C): 42.2 ml 19.19 ml/m RA Volume:   43.10 ml  19.60 ml/m  AORTIC VALVE                   PULMONIC VALVE AV Area (Vmax):    1.93 cm    PV Vmax:        0.76 m/s AV Area (Vmean):   2.05 cm    PV Peak grad:   2.3 mmHg AV Area (VTI):     2.37 cm    RVOT Peak grad: 4 mmHg AV Vmax:           125.00 cm/s AV Vmean:          83.500 cm/s AV VTI:            0.222 m AV Peak Grad:      6.2 mmHg AV Mean Grad:      3.0 mmHg LVOT Vmax:         69.80 cm/s LVOT Vmean:        49.400 cm/s LVOT VTI:          0.152 m LVOT/AV VTI ratio: 0.68  AORTA Ao Root diam: 2.87 cm MITRAL VALVE               TRICUSPID VALVE MV Area (PHT): 4.74 cm  TR Peak grad:   15.7 mmHg MV Decel Time: 160 msec    TR Vmax:        198.00 cm/s MV E velocity: 96.00 cm/s MV A velocity: 77.10 cm/s  SHUNTS MV E/A ratio:  1.25        Systemic VTI:  0.15 m                            Systemic Diam: 2.10 cm Marcina MillardAlexander Paraschos MD Electronically signed by Marcina MillardAlexander Paraschos MD Signature Date/Time: 12/25/2020/3:09:42 PM    Final         Scheduled Meds: . aspirin  81 mg Oral Daily  . atorvastatin  80 mg Oral q1800  . Chlorhexidine Gluconate Cloth  6 each Topical Q0600  . losartan  25 mg Oral Daily  . metoprolol tartrate  12.5 mg Oral BID  . sodium chloride flush  3 mL Intravenous Q12H  . ticagrelor  90 mg Oral BID   Continuous Infusions: . sodium chloride      Assessment & Plan:   Active Problems:   STEMI (ST elevation myocardial infarction) (HCC)   STEMI involving oth coronary artery of inferior wall (HCC)  1.STEMI-status post cath, underwent PCI to distal RCA  Dual antiplatelet therapy uninterrupted for 1 year Completed Aggrastat infusion  Echo with normal EF Continue metoprolol and  losartan High intensity statin Ambulate as tolerated Follow-up cardiology in 1 to 2 weeks Cleared for discharge in a.m. by cardiology if asymptomatic  2.HTN-continue metoprolol and losartan  3.  Hypokalemia we will replace lites   DVT prophylaxis: SCD Code Status: Full Family Communication: None at bedside  Status is: Inpatient  Remains inpatient appropriate because:Inpatient level of care appropriate due to severity of illness   Dispo: The patient is from: Home              Anticipated d/c is to: Home              Patient currently is not medically stable to d/c.   Difficult to place patient No                     anticipated discharge in 1 day          LOS: 1 day   Time spent: 35 min with >50% on coc   Lynn ItoSahar Ernesha Ramone, MD [facility] Pager [phone]  If 7PM-7AM, please contact night-coverage 12/26/2020, 4:01 PM

## 2020-12-27 LAB — CBC
HCT: 40.5 % (ref 39.0–52.0)
Hemoglobin: 13.9 g/dL (ref 13.0–17.0)
MCH: 31.7 pg (ref 26.0–34.0)
MCHC: 34.3 g/dL (ref 30.0–36.0)
MCV: 92.5 fL (ref 80.0–100.0)
Platelets: 185 10*3/uL (ref 150–400)
RBC: 4.38 MIL/uL (ref 4.22–5.81)
RDW: 12.7 % (ref 11.5–15.5)
WBC: 13.7 10*3/uL — ABNORMAL HIGH (ref 4.0–10.5)
nRBC: 0 % (ref 0.0–0.2)

## 2020-12-27 LAB — BASIC METABOLIC PANEL
Anion gap: 8 (ref 5–15)
BUN: 13 mg/dL (ref 6–20)
CO2: 23 mmol/L (ref 22–32)
Calcium: 8.9 mg/dL (ref 8.9–10.3)
Chloride: 107 mmol/L (ref 98–111)
Creatinine, Ser: 0.88 mg/dL (ref 0.61–1.24)
GFR, Estimated: >60 mL/min (ref >60)
Glucose, Bld: 120 mg/dL — ABNORMAL HIGH (ref 70–99)
Potassium: 3.5 mmol/L (ref 3.5–5.1)
Sodium: 138 mmol/L (ref 135–145)

## 2020-12-27 MED ORDER — ASPIRIN 81 MG PO CHEW
81.0000 mg | CHEWABLE_TABLET | Freq: Every day | ORAL | 0 refills | Status: DC
Start: 2020-12-28 — End: 2020-12-27

## 2020-12-27 MED ORDER — NITROGLYCERIN 0.4 MG SL SUBL: 0.4000 mg | SUBLINGUAL_TABLET | SUBLINGUAL | 0 refills | Status: DC | PRN

## 2020-12-27 MED ORDER — ASPIRIN 81 MG PO CHEW: 81.0000 mg | CHEWABLE_TABLET | Freq: Every day | ORAL | 0 refills | Status: AC

## 2020-12-27 MED ORDER — ATORVASTATIN CALCIUM 80 MG PO TABS: 80.0000 mg | ORAL_TABLET | Freq: Every day | ORAL | 0 refills | Status: DC

## 2020-12-27 MED ORDER — METOPROLOL TARTRATE 25 MG PO TABS: 12.5000 mg | ORAL_TABLET | Freq: Two times a day (BID) | ORAL | 0 refills | Status: DC

## 2020-12-27 MED ORDER — TICAGRELOR 90 MG PO TABS: 90.0000 mg | ORAL_TABLET | Freq: Two times a day (BID) | ORAL | 1 refills | Status: DC

## 2020-12-27 MED ORDER — NITROGLYCERIN 0.4 MG SL SUBL: 0.4000 mg | SUBLINGUAL_TABLET | SUBLINGUAL | 1 refills | Status: DC | PRN

## 2020-12-27 MED ORDER — TICAGRELOR 90 MG PO TABS: 90.0000 mg | ORAL_TABLET | Freq: Two times a day (BID) | ORAL | 1 refills | Status: AC

## 2020-12-27 NOTE — Progress Notes (Signed)
Patient alert and oriented, VSS. Pt given discharge summary paperwork in folder. Educated patient regarding follow up with cardiology, new medication regimen, and where to pick up prescribed medications, patient verbalized understanding and all questions were answered. Peripheral IV removed, catheter tip intact. All belongings were accounted for and carried by patient out of facility. Transported to front door via wheelchair by [place], [place].

## 2020-12-27 NOTE — Progress Notes (Signed)
[facility] Cardiology  SUBJECTIVE: Patient sitting on side of the bed, denies chest pain or shortness of breath, expresses desire to go home   Vitals:   12/26/20 2154 12/26/20 2355 12/27/20 0450 12/27/20 0750  BP:  (!) 92/53 105/70 112/70  Pulse: 65 73 70 77  Resp: 20 20 18 20   Temp:  98.7 F (37.1 C) 98.5 F (36.9 C) 98.3 F (36.8 C)  TempSrc:  Oral Oral Oral  SpO2: 94% 97% 97% 98%  Weight:      Height:         Intake/Output Summary (Last 24 hours) at 12/27/2020 0915 Last data filed at 12/27/2020 0800 Gross per 24 hour  Intake 720 ml  Output 3575 ml  Net -2855 ml      PHYSICAL EXAM  General: Well developed, well nourished, in no acute distress HEENT:  Normocephalic and atramatic Neck:  No JVD.  Lungs: Clear bilaterally to auscultation and percussion. Heart: HRRR . Normal S1 and S2 without gallops or murmurs.  Abdomen: Bowel sounds are positive, abdomen soft and non-tender  Msk:  Back normal, normal gait. Normal strength and tone for age. Extremities: No clubbing, cyanosis or edema.   Neuro: Alert and oriented X 3. Psych:  Good affect, responds appropriately   LABS: Basic Metabolic Panel: Recent Labs    12/26/20 0953 12/27/20 0444  NA 138 138  K 3.3* 3.5  CL 104 107  CO2 27 23  GLUCOSE 119* 120*  BUN 11 13  CREATININE 0.76 0.88  CALCIUM 9.1 8.9   Liver Function Tests: Recent Labs    12/25/20 0006  AST 31  ALT 31  ALKPHOS 65  BILITOT 0.6  PROT 7.2  ALBUMIN 4.1   No results for input(s): LIPASE, AMYLASE in the last 72 hours. CBC: Recent Labs    12/25/20 0006 12/25/20 0230 12/27/20 0444  WBC 18.0* 15.9* 13.7*  NEUTROABS 10.4*  --   --   HGB 15.2 14.6 13.9  HCT 45.6 43.5 40.5  MCV 93.8 93.3 92.5  PLT 263 234 185   Cardiac Enzymes: No results for input(s): CKTOTAL, CKMB, CKMBINDEX, TROPONINI in the last 72 hours. BNP: Invalid input(s): POCBNP D-Dimer: No results for input(s): DDIMER in the last 72 hours. Hemoglobin A1C: No results for  input(s): HGBA1C in the last 72 hours. Fasting Lipid Panel: No results for input(s): CHOL, HDL, LDLCALC, TRIG, CHOLHDL, LDLDIRECT in the last 72 hours. Thyroid Function Tests: No results for input(s): TSH, T4TOTAL, T3FREE, THYROIDAB in the last 72 hours.  Invalid input(s): FREET3 Anemia Panel: No results for input(s): VITAMINB12, FOLATE, FERRITIN, TIBC, IRON, RETICCTPCT in the last 72 hours.  ECHOCARDIOGRAM COMPLETE  Result Date: 12/25/2020    ECHOCARDIOGRAM REPORT   Patient Name:   Ronald Valdez Date of Exam: 12/25/2020 Medical Rec #:  02/24/2021        Height:       68.9 in Accession #:    [id number]       Weight:       232.4 lb Date of Birth:  11-26-1970        BSA:          2.199 m Patient Age:    50 years         BP:           99/63 mmHg Patient Gender: M                HR:  59 bpm. Exam Location:  [facility] Procedure: 2D Echo, Cardiac Doppler, Color Doppler and Strain Analysis Indications:     Acute myocardial infarction unspecified I21.9  History:         Patient has no prior history of Echocardiogram examinations.                  Risk Factors:Hypertension.  Sonographer:     Cristela Blue RDCS (AE) Referring Phys:  [id number] Lyn Hollingshead Jamelle Goldston Diagnosing Phys: Marcina Millard MD  Sonographer Comments: Technically challenging study due to limited acoustic windows and suboptimal apical window. IMPRESSIONS  1. Left ventricular ejection fraction, by estimation, is 60 to 65%. The left ventricle has normal function. The left ventricle has no regional wall motion abnormalities. Left ventricular diastolic parameters were normal.  2. Right ventricular systolic function is normal. The right ventricular size is normal.  3. The mitral valve is normal in structure. Mild mitral valve regurgitation. No evidence of mitral stenosis.  4. The aortic valve is normal in structure. Aortic valve regurgitation is not visualized. No aortic stenosis is present.  5. The inferior vena cava is normal in size with greater  than 50% respiratory variability, suggesting right atrial pressure of 3 mmHg. FINDINGS  Left Ventricle: Left ventricular ejection fraction, by estimation, is 60 to 65%. The left ventricle has normal function. The left ventricle has no regional wall motion abnormalities. The left ventricular internal cavity size was normal in size. There is  no left ventricular hypertrophy. Left ventricular diastolic parameters were normal. Right Ventricle: The right ventricular size is normal. No increase in right ventricular wall thickness. Right ventricular systolic function is normal. Left Atrium: Left atrial size was normal in size. Right Atrium: Right atrial size was normal in size. Pericardium: There is no evidence of pericardial effusion. Mitral Valve: The mitral valve is normal in structure. Mild mitral valve regurgitation. No evidence of mitral valve stenosis. Tricuspid Valve: The tricuspid valve is normal in structure. Tricuspid valve regurgitation is mild . No evidence of tricuspid stenosis. Aortic Valve: The aortic valve is normal in structure. Aortic valve regurgitation is not visualized. No aortic stenosis is present. Aortic valve mean gradient measures 3.0 mmHg. Aortic valve peak gradient measures 6.2 mmHg. Aortic valve area, by VTI measures 2.37 cm. Pulmonic Valve: The pulmonic valve was normal in structure. Pulmonic valve regurgitation is not visualized. No evidence of pulmonic stenosis. Aorta: The aortic root is normal in size and structure. Venous: The inferior vena cava is normal in size with greater than 50% respiratory variability, suggesting right atrial pressure of 3 mmHg. IAS/Shunts: No atrial level shunt detected by color flow Doppler.  LEFT VENTRICLE PLAX 2D LVIDd:         4.38 cm  Diastology LVIDs:         2.73 cm  LV e' medial:    5.55 cm/s LV PW:         1.46 cm  LV E/e' medial:  17.3 LV IVS:        0.87 cm  LV e' lateral:   9.36 cm/s LVOT diam:     2.10 cm  LV E/e' lateral: 10.3 LV SV:         53 LV  SV Index:   24 LVOT Area:     3.46 cm  RIGHT VENTRICLE RV Basal diam:  2.83 cm RV S prime:     11.20 cm/s TAPSE (M-mode): 3.7 cm LEFT ATRIUM           Index  RIGHT ATRIUM           Index LA diam:      4.30 cm 1.96 cm/m  RA Area:     16.90 cm LA Vol (A4C): 42.2 ml 19.19 ml/m RA Volume:   43.10 ml  19.60 ml/m  AORTIC VALVE                   PULMONIC VALVE AV Area (Vmax):    1.93 cm    PV Vmax:        0.76 m/s AV Area (Vmean):   2.05 cm    PV Peak grad:   2.3 mmHg AV Area (VTI):     2.37 cm    RVOT Peak grad: 4 mmHg AV Vmax:           125.00 cm/s AV Vmean:          83.500 cm/s AV VTI:            0.222 m AV Peak Grad:      6.2 mmHg AV Mean Grad:      3.0 mmHg LVOT Vmax:         69.80 cm/s LVOT Vmean:        49.400 cm/s LVOT VTI:          0.152 m LVOT/AV VTI ratio: 0.68  AORTA Ao Root diam: 2.87 cm MITRAL VALVE               TRICUSPID VALVE MV Area (PHT): 4.74 cm    TR Peak grad:   15.7 mmHg MV Decel Time: 160 msec    TR Vmax:        198.00 cm/s MV E velocity: 96.00 cm/s MV A velocity: 77.10 cm/s  SHUNTS MV E/A ratio:  1.25        Systemic VTI:  0.15 m                            Systemic Diam: 2.10 cm Marcina Millard MD Electronically signed by Marcina Millard MD Signature Date/Time: 12/25/2020/3:09:42 PM    Final      Echo LVEF 60 to 65%  TELEMETRY: Sinus rhythm 60 bpm:  ASSESSMENT AND PLAN:  Active Problems:   STEMI (ST elevation myocardial infarction) (HCC)   STEMI involving oth coronary artery of inferior wall (HCC)    1.  Inferior STEMI, status post primary PCI with 2.5 x 28 mm Synergy drug-eluting stent distal RCA with mild distal embolization treated with Aggrastat infusion, currently chest pain-free, with preserved left ventricular function by 2D echocardiogram 2.Essential hypertension,blood pressure well controlled on metoprolol tartrate and losartan  Recommendations  1. Continue dual antiplatelet therapy uninterrupted for 1 year 2. Continue metoprolol tartrate and  losartan 3. Continue high intensity atorvastatin 4.  Discharge patient home 5.  Follow-up in 1 week  Marcina Millard, MD, PhD, [facility] 12/27/2020 9:15 AM

## 2020-12-27 NOTE — Discharge Summary (Addendum)
Ronald Valdez FIE:[id number] DOB: [birth year] DOA: 12/24/2020  PCP: Patient, No Pcp Per  Admit date: 12/24/2020 Discharge date: 12/27/2020  Admitted From: Home Home she is Disposition: Home  Recommendations for Outpatient Follow-up:  1. Follow up with PCP in 1 week 2. Please obtain BMP/CBC in one week 3. Cardiology Dr. Darrold JunkerParaschos in 1 week     Discharge Condition:Stable CODE STATUS: Full Diet recommendation: Heart Healthy Brief/Interim Summary: Per ZYS:AYTKHPI:Ronald Valdez is a 50 y.o. male with medical history significant for hypertension, ongoing tobacco abuse and retinitis pigmentosa, who presented to the emergency room as a code STEMI.  The patient is having 3-hour period of substernal chest pain graded 10/10 in severity today and it has been going on since early this week and recurred yesterday morning.  Around 10:30 PM chest pain became more severe and with associated  nausea and diaphoresis without vomiting.  EKG revealed inferior ST elevation and lateral depression.  Cardiology was consulted.  Chest x-ray without any acute cardiopulmonary disease.He underwent cardiac catheterization and is status post PCI distal RCA stent placement  1. Inferior STEMI,statuspost primary PCI with 2.5 x 28 mm Synergy drug-eluting stent distal RCA with mild distal embolization treated withAggrastat infusion. -Dual antiplatelet therapy uninterrupted for 1 year -Completed Aggrastat infusion  -Acute Echo with normal EF -Continue metoprolol and losartan -High intensity statin, goal LDL less than 70 Ambulated and without any chest pain or shortness of breath Follow-up cardiology in 1 week Cleared by cardiology for discharge - 2.Essential hypertension,blood pressure well controlledon metoprolol tartrate and losartan     Discharge Diagnoses:  Active Problems:   STEMI (ST elevation myocardial infarction) (HCC)   STEMI involving oth coronary artery of inferior wall [facility](HCC)    Discharge  Instructions  Discharge Instructions    AMB Referral to Cardiac Rehabilitation - Phase II   Complete by: As directed    Diagnosis:  STEMI Coronary Stents     After initial evaluation and assessments completed: Virtual Based Care may be provided alone or in conjunction with Phase 2 Cardiac Rehab based on patient barriers.: Yes   Call MD for:  severe uncontrolled pain   Complete by: As directed    Diet - low sodium heart healthy   Complete by: As directed    Discharge instructions   Complete by: As directed    Avoid nSAIDS , mobic since it will cause GI bleed while you are on brilinta and aspirin F/u with cardiology in one week   Increase activity slowly   Complete by: As directed      Allergies as of 12/27/2020   No Known Allergies     Medication List    STOP taking these medications   meloxicam 15 MG tablet Commonly known as: MOBIC     TAKE these medications   aspirin 81 MG chewable tablet Chew 1 tablet (81 mg total) by mouth daily. Start taking on: December 28, 2020   atorvastatin 80 MG tablet Commonly known as: LIPITOR Take 1 tablet (80 mg total) by mouth daily at 6 PM.   HYDROcodone-acetaminophen 5-325 MG tablet Commonly known as: NORCO/VICODIN Take 1 tablet by mouth every 6 (six) hours as needed for moderate pain (Tooth extraction).   losartan 25 MG tablet Commonly known as: COZAAR Take 25 mg by mouth daily.   metoprolol tartrate 25 MG tablet Commonly known as: LOPRESSOR Take 0.5 tablets (12.5 mg total) by mouth 2 (two) times daily.   nitroGLYCERIN 0.4 MG SL tablet Commonly known as: NITROSTAT  Place 1 tablet (0.4 mg total) under the tongue every 5 (five) minutes as needed for chest pain.   ticagrelor 90 MG Tabs tablet Commonly known as: BRILINTA Take 1 tablet (90 mg total) by mouth 2 (two) times daily.       Follow-up Information    Paraschos, Alexander, MD Follow up in 1 week(s).   Specialty: Cardiology Contact information: [address]  [address] [facility] [place] [address] [phone]              No Known Allergies  Consultations: Cardiology  Procedures/Studies: CARDIAC CATHETERIZATION  Result Date: 12/25/2020  Dist LAD lesion is 30% stenosed.  Dist RCA lesion is 100% stenosed.  A drug-eluting stent was successfully placed using a STENT SYNERGY DES 2.5X28.  Post intervention, there is a 0% residual stenosis.  1.  Inferior STEMI 2.  Acute occlusion distal RCA 3.  Mildly reduced left ventricular function 4.  Successful primary PCI with 2.5 x 28 mm drug-eluting stent distal RCA with mild distal embolization Recommendations 1.  Dual antiplatelet therapy uninterrupted for 1 year 2.  Aggrastat infusion for 18 hours 3.  Resume losartan 25 mg daily 4.  Start metoprolol tartrate 12.5 mg twice daily 5.  Start high intensity atorvastatin 80 mg daily 6.  2D echocardiogram   DG Chest Port 1 View  Result Date: 12/25/2020 CLINICAL DATA:  Chest pain. EXAM: PORTABLE CHEST 1 VIEW COMPARISON:  November 25, 2014 FINDINGS: Decreased lung volumes are seen which is likely secondary to the degree of patient inspiration. Multiple radiopaque tags and lead wires are seen with subsequently limited evaluation of the left lung. The right lung is clear. There is no evidence of a pleural effusion or pneumothorax. There is mild to moderate severity enlargement of the cardiac silhouette. The visualized skeletal structures are unremarkable. IMPRESSION: Limited study, as described above, demonstrating no acute or active cardiopulmonary disease. Electronically Signed   By: Aram Candela M.D.   On: 12/25/2020 00:21   ECHOCARDIOGRAM COMPLETE  Result Date: 12/25/2020    ECHOCARDIOGRAM REPORT   Patient Name:   Ronald Valdez Date of Exam: 12/25/2020 Medical Rec #:  [id number]        Height:       68.9 in Accession #:    [id number]       Weight:       232.4 lb Date of Birth:  May 19, 1971        BSA:          2.199 m Patient Age:    49 years          BP:           99/63 mmHg Patient Gender: M                HR:           59 bpm. Exam Location:  [facility] Procedure: 2D Echo, Cardiac Doppler, Color Doppler and Strain Analysis Indications:     Acute myocardial infarction unspecified I21.9  History:         Patient has no prior history of Echocardiogram examinations.                  Risk Factors:Hypertension.  Sonographer:     Cristela Blue RDCS (AE) Referring Phys:  [id number] Lyn Hollingshead PARASCHOS Diagnosing Phys: Marcina Millard MD  Sonographer Comments: Technically challenging study due to limited acoustic windows and suboptimal apical window. IMPRESSIONS  1. Left ventricular ejection fraction, by estimation, is 60  to 65%. The left ventricle has normal function. The left ventricle has no regional wall motion abnormalities. Left ventricular diastolic parameters were normal.  2. Right ventricular systolic function is normal. The right ventricular size is normal.  3. The mitral valve is normal in structure. Mild mitral valve regurgitation. No evidence of mitral stenosis.  4. The aortic valve is normal in structure. Aortic valve regurgitation is not visualized. No aortic stenosis is present.  5. The inferior vena cava is normal in size with greater than 50% respiratory variability, suggesting right atrial pressure of 3 mmHg. FINDINGS  Left Ventricle: Left ventricular ejection fraction, by estimation, is 60 to 65%. The left ventricle has normal function. The left ventricle has no regional wall motion abnormalities. The left ventricular internal cavity size was normal in size. There is  no left ventricular hypertrophy. Left ventricular diastolic parameters were normal. Right Ventricle: The right ventricular size is normal. No increase in right ventricular wall thickness. Right ventricular systolic function is normal. Left Atrium: Left atrial size was normal in size. Right Atrium: Right atrial size was normal in size. Pericardium: There is no evidence of pericardial  effusion. Mitral Valve: The mitral valve is normal in structure. Mild mitral valve regurgitation. No evidence of mitral valve stenosis. Tricuspid Valve: The tricuspid valve is normal in structure. Tricuspid valve regurgitation is mild . No evidence of tricuspid stenosis. Aortic Valve: The aortic valve is normal in structure. Aortic valve regurgitation is not visualized. No aortic stenosis is present. Aortic valve mean gradient measures 3.0 mmHg. Aortic valve peak gradient measures 6.2 mmHg. Aortic valve area, by VTI measures 2.37 cm. Pulmonic Valve: The pulmonic valve was normal in structure. Pulmonic valve regurgitation is not visualized. No evidence of pulmonic stenosis. Aorta: The aortic root is normal in size and structure. Venous: The inferior vena cava is normal in size with greater than 50% respiratory variability, suggesting right atrial pressure of 3 mmHg. IAS/Shunts: No atrial level shunt detected by color flow Doppler.  LEFT VENTRICLE PLAX 2D LVIDd:         4.38 cm  Diastology LVIDs:         2.73 cm  LV e' medial:    5.55 cm/s LV PW:         1.46 cm  LV E/e' medial:  17.3 LV IVS:        0.87 cm  LV e' lateral:   9.36 cm/s LVOT diam:     2.10 cm  LV E/e' lateral: 10.3 LV SV:         53 LV SV Index:   24 LVOT Area:     3.46 cm  RIGHT VENTRICLE RV Basal diam:  2.83 cm RV S prime:     11.20 cm/s TAPSE (M-mode): 3.7 cm LEFT ATRIUM           Index       RIGHT ATRIUM           Index LA diam:      4.30 cm 1.96 cm/m  RA Area:     16.90 cm LA Vol (A4C): 42.2 ml 19.19 ml/m RA Volume:   43.10 ml  19.60 ml/m  AORTIC VALVE                   PULMONIC VALVE AV Area (Vmax):    1.93 cm    PV Vmax:        0.76 m/s AV Area (Vmean):   2.05 cm    PV  Peak grad:   2.3 mmHg AV Area (VTI):     2.37 cm    RVOT Peak grad: 4 mmHg AV Vmax:           125.00 cm/s AV Vmean:          83.500 cm/s AV VTI:            0.222 m AV Peak Grad:      6.2 mmHg AV Mean Grad:      3.0 mmHg LVOT Vmax:         69.80 cm/s LVOT Vmean:         49.400 cm/s LVOT VTI:          0.152 m LVOT/AV VTI ratio: 0.68  AORTA Ao Root diam: 2.87 cm MITRAL VALVE               TRICUSPID VALVE MV Area (PHT): 4.74 cm    TR Peak grad:   15.7 mmHg MV Decel Time: 160 msec    TR Vmax:        198.00 cm/s MV E velocity: 96.00 cm/s MV A velocity: 77.10 cm/s  SHUNTS MV E/A ratio:  1.25        Systemic VTI:  0.15 m                            Systemic Diam: 2.10 cm Marcina Millard MD Electronically signed by Marcina Millard MD Signature Date/Time: 12/25/2020/3:09:42 PM    Final        Subjective: No chest pain or shortness of breath, no dizziness  Discharge Exam: Vitals:   12/27/20 0450 12/27/20 0750  BP: 105/70 112/70  Pulse: 70 77  Resp: 18 20  Temp: 98.5 F (36.9 C) 98.3 F (36.8 C)  SpO2: 97% 98%   Vitals:   12/26/20 2154 12/26/20 2355 12/27/20 0450 12/27/20 0750  BP:  (!) 92/53 105/70 112/70  Pulse: 65 73 70 77  Resp: 20 20 18 20   Temp:  98.7 F (37.1 C) 98.5 F (36.9 C) 98.3 F (36.8 C)  TempSrc:  Oral Oral Oral  SpO2: 94% 97% 97% 98%  Weight:      Height:        General: Pt is alert, awake, not in acute distress Cardiovascular: RRR, S1/S2 +, no rubs, no gallops Respiratory: CTA bilaterally, no wheezing, no rhonchi Abdominal: Soft, NT, ND, bowel sounds + Extremities: no edema, no cyanosis    The results of significant diagnostics from this hospitalization (including imaging, microbiology, ancillary and laboratory) are listed below for reference.     Microbiology: Recent Results (from the past 240 hour(s))  Resp Panel by RT-PCR (Flu A&B, Covid) Nasopharyngeal Swab     Status: None   Collection Time: 12/25/20 12:06 AM   Specimen: Nasopharyngeal Swab; Nasopharyngeal(NP) swabs in vial transport medium  Result Value Ref Range Status   SARS Coronavirus 2 by RT PCR NEGATIVE NEGATIVE Final    Comment: (NOTE) SARS-CoV-2 target nucleic acids are NOT DETECTED.  The SARS-CoV-2 RNA is generally detectable in upper  respiratory specimens during the acute phase of infection. The lowest concentration of SARS-CoV-2 viral copies this assay can detect is 138 copies/mL. A negative result does not preclude SARS-Cov-2 infection and should not be used as the sole basis for treatment or other patient management decisions. A negative result may occur with  improper specimen collection/handling, submission of specimen other than nasopharyngeal swab, presence of viral mutation(s) within  the areas targeted by this assay, and inadequate number of viral copies(<138 copies/mL). A negative result must be combined with clinical observations, patient history, and epidemiological information. The expected result is Negative.  Fact Sheet for Patients:  BloggerCourse.com  Fact Sheet for Healthcare Providers:  SeriousBroker.it  This test is no t yet approved or cleared by the [place] FDA and  has been authorized for detection and/or diagnosis of SARS-CoV-2 by FDA under an Emergency Use Authorization (EUA). This EUA will remain  in effect (meaning this test can be used) for the duration of the COVID-19 declaration under Section 564(b)(1) of the Act, 21 U.S.C.section 360bbb-3(b)(1), unless the authorization is terminated  or revoked sooner.       Influenza A by PCR NEGATIVE NEGATIVE Final   Influenza B by PCR NEGATIVE NEGATIVE Final    Comment: (NOTE) The Xpert Xpress SARS-CoV-2/FLU/RSV plus assay is intended as an aid in the diagnosis of influenza from Nasopharyngeal swab specimens and should not be used as a sole basis for treatment. Nasal washings and aspirates are unacceptable for Xpert Xpress SARS-CoV-2/FLU/RSV testing.  Fact Sheet for Patients: BloggerCourse.com  Fact Sheet for Healthcare Providers: SeriousBroker.it  This test is not yet approved or cleared by the [place] FDA and has been  authorized for detection and/or diagnosis of SARS-CoV-2 by FDA under an Emergency Use Authorization (EUA). This EUA will remain in effect (meaning this test can be used) for the duration of the COVID-19 declaration under Section 564(b)(1) of the Act, 21 U.S.C. section 360bbb-3(b)(1), unless the authorization is terminated or revoked.  Performed at [facility], [address] [place], [place] [address]   MRSA PCR Screening     Status: None   Collection Time: 12/25/20  2:13 AM   Specimen: Nasal Mucosa; Nasopharyngeal  Result Value Ref Range Status   MRSA by PCR NEGATIVE NEGATIVE Final    Comment:        The GeneXpert MRSA Assay (FDA approved for NASAL specimens only), is one component of a comprehensive MRSA colonization surveillance program. It is not intended to diagnose MRSA infection nor to guide or monitor treatment for MRSA infections. Performed at [facility], [address] [place], [place] [address]      Labs: BNP (last 3 results) No results for input(s): BNP in the last 8760 hours. Basic Metabolic Panel: Recent Labs  Lab 12/25/20 0006 12/26/20 0953 12/27/20 0444  NA 139 138 138  K 3.1* 3.3* 3.5  CL 105 104 107  CO2 24 27 23   GLUCOSE 194* 119* 120*  BUN 13 11 13   CREATININE 1.07 0.76 0.88  CALCIUM 8.8* 9.1 8.9   Liver Function Tests: Recent Labs  Lab 12/25/20 0006  AST 31  ALT 31  ALKPHOS 65  BILITOT 0.6  PROT 7.2  ALBUMIN 4.1   No results for input(s): LIPASE, AMYLASE in the last 168 hours. No results for input(s): AMMONIA in the last 168 hours. CBC: Recent Labs  Lab 12/25/20 0006 12/25/20 0230 12/27/20 0444  WBC 18.0* 15.9* 13.7*  NEUTROABS 10.4*  --   --   HGB 15.2 14.6 13.9  HCT 45.6 43.5 40.5  MCV 93.8 93.3 92.5  PLT 263 234 185   Cardiac Enzymes: No results for input(s): CKTOTAL, CKMB, CKMBINDEX, TROPONINI in the last 168 hours. BNP: Invalid input(s): POCBNP CBG: Recent Labs  Lab 12/25/20 0200   GLUCAP 144*   D-Dimer No results for input(s): DDIMER in the last 72 hours. Hgb A1c No  results for input(s): HGBA1C in the last 72 hours. Lipid Profile No results for input(s): CHOL, HDL, LDLCALC, TRIG, CHOLHDL, LDLDIRECT in the last 72 hours. Thyroid function studies No results for input(s): TSH, T4TOTAL, T3FREE, THYROIDAB in the last 72 hours.  Invalid input(s): FREET3 Anemia work up No results for input(s): VITAMINB12, FOLATE, FERRITIN, TIBC, IRON, RETICCTPCT in the last 72 hours. Urinalysis    Component Value Date/Time   COLORURINE YELLOW 08/20/2009 0737   APPEARANCEUR CLEAR 08/20/2009 0737   LABSPEC 1.023 08/20/2009 0737   PHURINE 5.5 08/20/2009 0737   GLUCOSEU NEGATIVE 08/20/2009 0737   HGBUR NEGATIVE 08/20/2009 0737   BILIRUBINUR NEGATIVE 08/20/2009 0737   KETONESUR NEGATIVE 08/20/2009 0737   PROTEINUR NEGATIVE 08/20/2009 0737   UROBILINOGEN 0.2 08/20/2009 0737   NITRITE NEGATIVE 08/20/2009 0737   LEUKOCYTESUR SMALL (A) 08/20/2009 0737   Sepsis Labs Invalid input(s): PROCALCITONIN,  WBC,  LACTICIDVEN Microbiology Recent Results (from the past 240 hour(s))  Resp Panel by RT-PCR (Flu A&B, Covid) Nasopharyngeal Swab     Status: None   Collection Time: 12/25/20 12:06 AM   Specimen: Nasopharyngeal Swab; Nasopharyngeal(NP) swabs in vial transport medium  Result Value Ref Range Status   SARS Coronavirus 2 by RT PCR NEGATIVE NEGATIVE Final    Comment: (NOTE) SARS-CoV-2 target nucleic acids are NOT DETECTED.  The SARS-CoV-2 RNA is generally detectable in upper respiratory specimens during the acute phase of infection. The lowest concentration of SARS-CoV-2 viral copies this assay can detect is 138 copies/mL. A negative result does not preclude SARS-Cov-2 infection and should not be used as the sole basis for treatment or other patient management decisions. A negative result may occur with  improper specimen collection/handling, submission of specimen other than  nasopharyngeal swab, presence of viral mutation(s) within the areas targeted by this assay, and inadequate number of viral copies(<138 copies/mL). A negative result must be combined with clinical observations, patient history, and epidemiological information. The expected result is Negative.  Fact Sheet for Patients:  BloggerCourse.com  Fact Sheet for Healthcare Providers:  SeriousBroker.it  This test is no t yet approved or cleared by the [place] FDA and  has been authorized for detection and/or diagnosis of SARS-CoV-2 by FDA under an Emergency Use Authorization (EUA). This EUA will remain  in effect (meaning this test can be used) for the duration of the COVID-19 declaration under Section 564(b)(1) of the Act, 21 U.S.C.section 360bbb-3(b)(1), unless the authorization is terminated  or revoked sooner.       Influenza A by PCR NEGATIVE NEGATIVE Final   Influenza B by PCR NEGATIVE NEGATIVE Final    Comment: (NOTE) The Xpert Xpress SARS-CoV-2/FLU/RSV plus assay is intended as an aid in the diagnosis of influenza from Nasopharyngeal swab specimens and should not be used as a sole basis for treatment. Nasal washings and aspirates are unacceptable for Xpert Xpress SARS-CoV-2/FLU/RSV testing.  Fact Sheet for Patients: BloggerCourse.com  Fact Sheet for Healthcare Providers: SeriousBroker.it  This test is not yet approved or cleared by the [place] FDA and has been authorized for detection and/or diagnosis of SARS-CoV-2 by FDA under an Emergency Use Authorization (EUA). This EUA will remain in effect (meaning this test can be used) for the duration of the COVID-19 declaration under Section 564(b)(1) of the Act, 21 U.S.C. section 360bbb-3(b)(1), unless the authorization is terminated or revoked.  Performed at [facility], [address] [place], [place]  [address]   MRSA PCR Screening     Status: None  Collection Time: 12/25/20  2:13 AM   Specimen: Nasal Mucosa; Nasopharyngeal  Result Value Ref Range Status   MRSA by PCR NEGATIVE NEGATIVE Final    Comment:        The GeneXpert MRSA Assay (FDA approved for NASAL specimens only), is one component of a comprehensive MRSA colonization surveillance program. It is not intended to diagnose MRSA infection nor to guide or monitor treatment for MRSA infections. Performed at [facility], [address] [place], [place] [address]      Time coordinating discharge: Over 30 minutes  SIGNED:   Lynn Ito, MD  [facility] 12/27/2020, 9:20 AM Pager   If 7PM-7AM, please contact night-coverage [email or web address] Password [email or web address]

## 2020-12-27 NOTE — Progress Notes (Signed)
Patient called stating his pharmacy was closed today, requested medications be sent to the [facility] in [place], [place]. Pharmacy info updated in patient navigators and message sent to Dr. Marylu Lund to reroute prescriptions to new pharmacy.

## 2020-12-31 DIAGNOSIS — Z23 Encounter for immunization: Secondary | ICD-10-CM | POA: Diagnosis not present

## 2020-12-31 DIAGNOSIS — I2119 ST elevation (STEMI) myocardial infarction involving other coronary artery of inferior wall: Secondary | ICD-10-CM | POA: Diagnosis not present

## 2020-12-31 DIAGNOSIS — I2510 Atherosclerotic heart disease of native coronary artery without angina pectoris: Secondary | ICD-10-CM | POA: Diagnosis not present

## 2020-12-31 DIAGNOSIS — R42 Dizziness and giddiness: Secondary | ICD-10-CM | POA: Diagnosis not present

## 2021-01-14 DIAGNOSIS — Z961 Presence of intraocular lens: Secondary | ICD-10-CM | POA: Diagnosis not present

## 2021-01-14 DIAGNOSIS — B399 Histoplasmosis, unspecified: Secondary | ICD-10-CM | POA: Diagnosis not present

## 2021-01-14 DIAGNOSIS — H3552 Pigmentary retinal dystrophy: Secondary | ICD-10-CM | POA: Diagnosis not present

## 2021-01-21 DIAGNOSIS — H3552 Pigmentary retinal dystrophy: Secondary | ICD-10-CM | POA: Diagnosis not present

## 2021-01-29 ENCOUNTER — Other Ambulatory Visit: Payer: Self-pay

## 2021-01-29 ENCOUNTER — Encounter (HOSPITAL_COMMUNITY): Payer: Self-pay

## 2021-01-29 ENCOUNTER — Encounter (HOSPITAL_COMMUNITY)
Admission: RE | Admit: 2021-01-29 | Discharge: 2021-01-29 | Disposition: A | Discharge status: Home / Self Care | Payer: BC Managed Care – PPO | Source: Ambulatory Visit | Attending: Cardiology | Admitting: Cardiology

## 2021-01-29 VITALS — BP 124/80 | HR 65 | Ht 68.0 in | Wt 232.8 lb

## 2021-01-29 DIAGNOSIS — Z955 Presence of coronary angioplasty implant and graft: Secondary | ICD-10-CM | POA: Insufficient documentation

## 2021-01-29 NOTE — Progress Notes (Signed)
Cardiac Individual Treatment Plan  Patient Details  Name: Ronald Valdez MRN: [id number] Date of Birth: 08/29/1971 Referring Provider:   Flowsheet Row CARDIAC REHAB PHASE II ORIENTATION from 01/29/2021 in [facility] CARDIAC RE[facility]ILITATION  Referring Provider Dr. Darrold Junker      Initial Encounter Date:  Flowsheet Row CARDIAC REHAB PHASE II ORIENTATION from 01/29/2021 in [place] CARDIAC REHABILITATION  Date 01/29/21      Visit Diagnosis: Status post coronary artery stent placement  Patient's Home Medications on Admission:  Current Outpatient Medications:  .  atorvastatin (LIPITOR) 80 MG tablet, Take 1 tablet (80 mg total) by mouth daily at 6 PM., Disp: 30 tablet, Rfl: 0 .  HYDROcodone-acetaminophen (NORCO/VICODIN) 5-325 MG per tablet, Take 1 tablet by mouth every 6 (six) hours as needed for moderate pain (Tooth extraction)., Disp: , Rfl:  .  losartan (COZAAR) 25 MG tablet, Take 25 mg by mouth daily., Disp: , Rfl:  .  metoprolol tartrate (LOPRESSOR) 25 MG tablet, Take 0.5 tablets (12.5 mg total) by mouth 2 (two) times daily., Disp: 30 tablet, Rfl: 0 .  nitroGLYCERIN (NITROSTAT) 0.4 MG SL tablet, Place 1 tablet (0.4 mg total) under the tongue every 5 (five) minutes as needed for chest pain., Disp: 30 tablet, Rfl: 0  Past Medical History: Past Medical History:  Diagnosis Date  . Hypertension   . Retinitis pigmentosa     Tobacco Use: Social History   Tobacco Use  Smoking Status Former Smoker  . Packs/day: 1.50  . Years: 38.00  . Pack years: 57.00  . Types: Cigarettes  . Quit date: 12/24/2020  . Years since quitting: 0.0  Smokeless Tobacco Never Used    Labs: Recent Review Flowsheet Data   There is no flowsheet data to display.     Capillary Blood Glucose: Lab Results  Component Value Date   GLUCAP 144 (H) 12/25/2020     Exercise Target Goals: Exercise Program Goal: Individual exercise prescription set using results from initial 6 min walk test and THRR while  considering  patient's activity barriers and safety.   Exercise Prescription Goal: Starting with aerobic activity 30 plus minutes a day, 3 days per week for initial exercise prescription. Provide home exercise prescription and guidelines that participant acknowledges understanding prior to discharge.  Activity Barriers & Risk Stratification:  Activity Barriers & Cardiac Risk Stratification - 01/29/21 1248      Activity Barriers & Cardiac Risk Stratification   Activity Barriers Arthritis;Deconditioning;Muscular Weakness;Shortness of Breath;Other (comment)    Comments heel spurs, retina pigmentosa    Cardiac Risk Stratification High           6 Minute Walk:  6 Minute Walk    Row Name 01/29/21 1410         6 Minute Walk   Phase Initial     Distance 1200 feet     Walk Time 6 minutes     # of Rest Breaks 1     MPH 2.27     METS 3.2     RPE 12     Perceived Dyspnea  15     VO2 Peak 11.21     Symptoms Yes (comment)     Comments standing rest due to SOB for 20 seconds half way into the test     Resting HR 65 bpm     Resting BP 124/80     Resting Oxygen Saturation  97 %     Exercise Oxygen Saturation  during 6 min walk 97 %  Max Ex. HR 76 bpm     Max Ex. BP 144/72     2 Minute Post BP 120/70            Oxygen Initial Assessment:   Oxygen Re-Evaluation:   Oxygen Discharge (Final Oxygen Re-Evaluation):   Initial Exercise Prescription:  Initial Exercise Prescription - 01/29/21 1400      Date of Initial Exercise RX and Referring Provider   Date 01/29/21    Referring Provider Dr. Darrold Junker    Expected Discharge Date 04/23/21      Treadmill   MPH 1    Grade 0    Minutes 17      NuStep   Level 1    SPM 80    Minutes 22      Prescription Details   Frequency (times per week) 3    Duration Progress to 30 minutes of continuous aerobic without signs/symptoms of physical distress      Intensity   THRR 40-80% of Max Heartrate 68-136    Ratings of Perceived  Exertion 11-13    Perceived Dyspnea 0-4      Resistance Training   Training Prescription Yes    Weight 3 lbs    Reps 10-15           Perform Capillary Blood Glucose checks as needed.  Exercise Prescription Changes:   Exercise Comments:   Exercise Goals and Review:  Exercise Goals    Row Name 01/29/21 1420             Exercise Goals   Increase Physical Activity Yes       Intervention Provide advice, education, support and counseling about physical activity/exercise needs.;Develop an individualized exercise prescription for aerobic and resistive training based on initial evaluation findings, risk stratification, comorbidities and participant's personal goals.       Expected Outcomes Short Term: Attend rehab on a regular basis to increase amount of physical activity.;Long Term: Add in home exercise to make exercise part of routine and to increase amount of physical activity.;Long Term: Exercising regularly at least 3-5 days a week.       Increase Strength and Stamina Yes       Intervention Provide advice, education, support and counseling about physical activity/exercise needs.;Develop an individualized exercise prescription for aerobic and resistive training based on initial evaluation findings, risk stratification, comorbidities and participant's personal goals.       Expected Outcomes Short Term: Increase workloads from initial exercise prescription for resistance, speed, and METs.;Short Term: Perform resistance training exercises routinely during rehab and add in resistance training at home;Long Term: Improve cardiorespiratory fitness, muscular endurance and strength as measured by increased METs and functional capacity ( )       Able to understand and use rate of perceived exertion (RPE) scale Yes       Intervention Provide education and explanation on how to use RPE scale       Expected Outcomes Short Term: Able to use RPE daily in rehab to express subjective intensity  level;Long Term:  Able to use RPE to guide intensity level when exercising independently       Knowledge and understanding of Target Heart Rate Range (THRR) Yes       Intervention Provide education and explanation of THRR including how the numbers were predicted and where they are located for reference       Expected Outcomes Short Term: Able to state/look up THRR;Long Term: Able to use THRR to govern intensity when exercising  independently;Short Term: Able to use daily as guideline for intensity in rehab       Able to check pulse independently Yes       Intervention Provide education and demonstration on how to check pulse in carotid and radial arteries.;Review the importance of being able to check your own pulse for safety during independent exercise       Expected Outcomes Short Term: Able to explain why pulse checking is important during independent exercise;Long Term: Able to check pulse independently and accurately       Understanding of Exercise Prescription Yes       Intervention Provide education, explanation, and written materials on patient's individual exercise prescription       Expected Outcomes Short Term: Able to explain program exercise prescription;Long Term: Able to explain home exercise prescription to exercise independently              Exercise Goals Re-Evaluation :    Discharge Exercise Prescription (Final Exercise Prescription Changes):   Nutrition:  Target Goals: Understanding of nutrition guidelines, daily intake of sodium 1500mg , cholesterol 200mg , calories 30% from fat and 7% or less from saturated fats, daily to have 5 or more servings of fruits and vegetables.  Biometrics:  Pre Biometrics - 01/29/21 1421      Pre Biometrics   Height 5\' 8"  (1.727 m)    Weight 232 lb 12.9 oz (105.6 kg)    Waist Circumference 46.5 inches    Hip Circumference 42.5 inches    Waist to Hip Ratio 1.09 %    BMI (Calculated) 35.41    Triceps Skinfold 10 mm    % Body Fat 31.3  %    Grip Strength 22.2 kg    Flexibility 10 in    Single Leg Stand 17.97 seconds            Nutrition Therapy Plan and Nutrition Goals:   Nutrition Assessments:  MEDIFICTS Score Key:  ?70 Need to make dietary changes   40-70 Heart Healthy Diet  ? 40 Therapeutic Level Cholesterol Diet   Picture Your Plate Scores:  Unhealthy dietary pattern with much room for improvement.  41-50 Dietary pattern unlikely to meet recommendations for good health and room for improvement.  51-60 More healthful dietary pattern, with some room for improvement.   >60 Healthy dietary pattern, although there may be some specific behaviors that could be improved.    Nutrition Goals Re-Evaluation:   Nutrition Goals Discharge (Final Nutrition Goals Re-Evaluation):   Psychosocial: Target Goals: Acknowledge presence or absence of significant depression and/or stress, maximize coping skills, provide positive support system. Participant is able to verbalize types and ability to use techniques and skills needed for reducing stress and depression.  Initial Review & Psychosocial Screening:  Initial Psych Review & Screening - 01/29/21 1250      Family Dynamics   Good Support System? Yes    Comments His wife is his support system. His two oldest sons support him as well.      Barriers   Psychosocial barriers to participate in program There are no identifiable barriers or psychosocial needs.      Screening Interventions   Interventions Encouraged to exercise    Expected Outcomes Long Term Goal: Stressors or current issues are controlled or eliminated.           Quality of Life Scores:  Quality of Life - 01/29/21 1402      Quality of Life   Select Quality of Life  Quality of Life Scores   Health/Function Pre 15.2 %    Socioeconomic Pre 26.57 %    Psych/Spiritual Pre 15.43 %    Family Pre 28.8 %    GLOBAL Pre 19.59 %          Scores of 19 and below usually indicate a  poorer quality of life in these areas.  A difference of  2-3 points is a clinically meaningful difference.  A difference of 2-3 points in the total score of the Quality of Life Index has been associated with significant improvement in overall quality of life, self-image, physical symptoms, and general health in studies assessing change in quality of life.  PHQ-9: Recent Review Flowsheet Data    [facility]   Decreased Interest 0   Down, Depressed, Hopeless 0   PHQ - 2 Score 0   Altered sleeping 3   Tired, decreased energy 3   Change in appetite 0   Feeling bad or failure about yourself  0   Trouble concentrating 0   Moving slowly or fidgety/restless 0   Suicidal thoughts 0   PHQ-9 Score 6   Difficult doing work/chores Very difficult      Interpretation of Total Score  Total Score Depression Severity:  1-4 = Minimal depression, 5-9 = Mild depression, 10-14 = Moderate depression, 15-19 = Moderately severe depression, 20-27 = Severe depression   Psychosocial Evaluation and Intervention:  Psychosocial Evaluation - 01/29/21 1403      Psychosocial Evaluation & Interventions   Interventions Encouraged to exercise with the program and follow exercise prescription    Comments Pt has no identifiable barriers to completing cardiac rehab. He is concerned about the amount of SOB that he has experienced with exertion since his STEMI. THis has impacted his ability to work and do things for his family. His QOL was a 19.59 and PHQ-9 was a 6. All of this is associated with SOB and lack of energy. 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 He does cope well with his problems and supports that he has a good support system between his wife and his two oldest sons. He quit smoking the day he was  admitted to the hosptial with his STEMI. He reports that he has not relapsed and has not really thought about smoking. His goals for the program are to lose some weight and to decrease his SOB with activities. He is hopeful that this program will get im back to his previous functioning.    Expected Outcomes The patient's SOB will be decreased and this will resolve some of the stress that he is experiencing.    Continue Psychosocial Services  No Follow up required           Psychosocial Re-Evaluation:   Psychosocial Discharge (Final Psychosocial Re-Evaluation):   Vocational Rehabilitation: Provide vocational rehab assistance to qualifying candidates.   Vocational Rehab Evaluation & Intervention:  Vocational Rehab - 01/29/21 1257      Initial Vocational Rehab Evaluation & Intervention   Assessment shows need for Vocational Rehabilitation No      Vocational Rehab Re-Evaulation   Comments He thinks that cardiac rehab will help him enough to get back to work.           Education: Education Goals: Education  classes will be provided on a weekly basis, covering required topics. Participant will state understanding/return demonstration of topics presented.  Learning Barriers/Preferences:  Learning Barriers/Preferences - 01/29/21 1256      Learning Barriers/Preferences   Learning Barriers Sight    Learning Preferences Pictoral;Skilled Demonstration           Education Topics: Hypertension, Hypertension Reduction -Define heart disease and high blood pressure. Discus how high blood pressure affects the body and ways to reduce high blood pressure.   Exercise and Your Heart -Discuss why it is important to exercise, the FITT principles of exercise, normal and abnormal responses to exercise, and how to exercise safely.   Angina -Discuss definition of angina, causes of angina, treatment of angina, and how to decrease risk of having angina.   Cardiac Medications -Review what  the following cardiac medications are used for, how they affect the body, and side effects that may occur when taking the medications.  Medications include Aspirin, Beta blockers, calcium channel blockers, ACE Inhibitors, angiotensin receptor blockers, diuretics, digoxin, and antihyperlipidemics.   Congestive Heart Failure -Discuss the definition of CHF, how to live with CHF, the signs and symptoms of CHF, and how keep track of weight and sodium intake.   Heart Disease and Intimacy -Discus the effect sexual activity has on the heart, how changes occur during intimacy as we age, and safety during sexual activity.   Smoking Cessation / COPD -Discuss different methods to quit smoking, the health benefits of quitting smoking, and the definition of COPD.   Nutrition I: Fats -Discuss the types of cholesterol, what cholesterol does to the heart, and how cholesterol levels can be controlled.   Nutrition II: Labels -Discuss the different components of food labels and how to read food label   Heart Parts/Heart Disease and PAD -Discuss the anatomy of the heart, the pathway of blood circulation through the heart, and these are affected by heart disease.   Stress I: Signs and Symptoms -Discuss the causes of stress, how stress may lead to anxiety and depression, and ways to limit stress.   Stress II: Relaxation -Discuss different types of relaxation techniques to limit stress.   Warning Signs of Stroke / TIA -Discuss definition of a stroke, what the signs and symptoms are of a stroke, and how to identify when someone is having stroke.   Knowledge Questionnaire Score:  Knowledge Questionnaire Score - 01/29/21 1256      Knowledge Questionnaire Score   Pre Score 22/24           Core Components/Risk Factors/Patient Goals at Admission:  Personal Goals and Risk Factors at Admission - 01/29/21 1301      Core Components/Risk Factors/Patient Goals on Admission    Weight Management  Yes;Obesity;Weight Loss    Intervention Weight Management: Develop a combined nutrition and exercise program designed to reach desired caloric intake, while maintaining appropriate intake of nutrient and fiber, sodium and fats, and appropriate energy expenditure required for the weight goal.;Weight Management: Provide education and appropriate resources to help participant work on and attain dietary goals.;Weight Management/Obesity: Establish reasonable short term and long term weight goals.;Obesity: Provide education and appropriate resources to help participant work on and attain dietary goals.    Admit Weight 227 lb (103 kg)    Goal Weight: Short Term 210 lb (95.3 kg)    Expected Outcomes Short Term: Continue to assess and modify interventions until short term weight is achieved;Long Term: Adherence to nutrition and physical activity/exercise program aimed toward attainment  of established weight goal;Weight Maintenance: Understanding of the daily nutrition guidelines, which includes 25-35% calories from fat, 7% or less cal from saturated fats, less than 200mg  cholesterol, less than 1.5gm of sodium, & 5 or more servings of fruits and vegetables daily;Weight Loss: Understanding of general recommendations for a balanced deficit meal plan, which promotes 1-2 lb weight loss per week and includes a negative energy balance of [phone] kcal/d;Understanding recommendations for meals to include 15-35% energy as protein, 25-35% energy from fat, 35-60% energy from carbohydrates, less than 200mg  of dietary cholesterol, 20-35 gm of total fiber daily;Understanding of distribution of calorie intake throughout the day with the consumption of 4-5 meals/snacks    Improve shortness of breath with ADL's Yes    Intervention Provide education, individualized exercise plan and daily activity instruction to help decrease symptoms of SOB with activities of daily living.    Expected Outcomes Short Term: Improve cardiorespiratory  fitness to achieve a reduction of symptoms when performing ADLs;Long Term: Be able to perform more ADLs without symptoms or delay the onset of symptoms           Core Components/Risk Factors/Patient Goals Review:    Core Components/Risk Factors/Patient Goals at Discharge (Final Review):    ITP Comments:   Comments: Patient arrived for 1st visit/orientation/education at 1230. Patient was referred to CR by Dr. Darrold Junker due to STEMI (I21.3) and status post coronary artery stent placement (Z95.5). During orientation advised patient on arrival and appointment times what to wear, what to do before, during and after exercise. Reviewed attendance and class policy.  Pt is scheduled to return Cardiac Rehab on 02/01/2021 at 0930. Pt was advised to come to class 15 minutes before class starts.  Discussed RPE/Dpysnea scales. Patient participated in warm up stretches. Patient was able to complete 6 minute walk test.  Telemetry: NSR with quadrigeminy while sitting after his walk test.  Patient was measured for the equipment. Discussed equipment safety with patient. Took patient pre-anthropometric measurements. Patient finished visit at 1345.

## 2021-02-01 ENCOUNTER — Other Ambulatory Visit: Payer: Self-pay

## 2021-02-01 ENCOUNTER — Encounter (HOSPITAL_COMMUNITY)
Admission: RE | Admit: 2021-02-01 | Discharge: 2021-02-01 | Disposition: A | Discharge status: Home / Self Care | Payer: BC Managed Care – PPO | Source: Ambulatory Visit | Attending: Cardiology | Admitting: Cardiology

## 2021-02-01 DIAGNOSIS — Z955 Presence of coronary angioplasty implant and graft: Secondary | ICD-10-CM | POA: Diagnosis not present

## 2021-02-01 NOTE — Progress Notes (Signed)
Daily Session Note  Patient Details  Name: Ronald Valdez MRN: [id number] Date of Birth: April 01, 1971 Referring Provider:   Flowsheet Row CARDIAC REHAB PHASE II ORIENTATION from 01/29/2021 in [place]  Referring Provider Dr. Saralyn Pilar      Encounter Date: 02/01/2021  Check In:  Session Check In - 02/01/21 0930      Check-In   Supervising physician immediately available to respond to emergencies [facility] MD immediately available    Physician(s) Dr. Melene Muller    Location [facility]    Staff Present Cathren Harsh, MS, Exercise Physiologist;Dalton Kris Mouton, MS, ACSM-CEP, Exercise Physiologist    Virtual Visit No    Medication changes reported     No    Fall or balance concerns reported    No    Tobacco Cessation No Change    Warm-up and Cool-down Performed as group-led instruction    Resistance Training Performed Yes    VAD Patient? No    PAD/SET Patient? No      Pain Assessment   Currently in Pain? No/denies    Multiple Pain Sites No           Capillary Blood Glucose: No results found for this or any previous visit (from the past 24 hour(s)).    Social History   Tobacco Use  Smoking Status Former Smoker  . Packs/day: 1.50  . Years: 38.00  . Pack years: 57.00  . Types: Cigarettes  . Quit date: 12/24/2020  . Years since quitting: 0.1  Smokeless Tobacco Never Used    Goals Met:  Independence with exercise equipment Exercise tolerated well No report of cardiac concerns or symptoms Strength training completed today  Goals Unmet:  Not Applicable  Comments: check out 1030   Dr. Kathie Dike is Medical Director for [facility] Pulmonary Rehab.

## 2021-02-03 ENCOUNTER — Encounter (HOSPITAL_COMMUNITY)
Admission: RE | Admit: 2021-02-03 | Discharge: 2021-02-03 | Disposition: A | Discharge status: Home / Self Care | Payer: BC Managed Care – PPO | Source: Ambulatory Visit | Attending: Cardiology | Admitting: Cardiology

## 2021-02-03 ENCOUNTER — Other Ambulatory Visit: Payer: Self-pay

## 2021-02-03 DIAGNOSIS — Z955 Presence of coronary angioplasty implant and graft: Secondary | ICD-10-CM | POA: Diagnosis not present

## 2021-02-03 NOTE — Progress Notes (Signed)
Daily Session Note  Patient Details  Name: Ronald Valdez MRN: [id number] Date of Birth: 1971-09-15 Referring Provider:   Flowsheet Row CARDIAC REHAB PHASE II ORIENTATION from 01/29/2021 in [place]  Referring Provider Dr. Saralyn Pilar      Encounter Date: 02/03/2021  Check In:  Session Check In - 02/03/21 0930      Check-In   Supervising physician immediately available to respond to emergencies [facility] MD immediately available    Physician(s) Dr. Melene Muller    Location [facility]    Staff Present Cathren Harsh, MS, Exercise Physiologist;Dalton Kris Mouton, MS, ACSM-CEP, Exercise Physiologist    Virtual Visit No    Medication changes reported     No    Fall or balance concerns reported    No    Tobacco Cessation No Change    Warm-up and Cool-down Performed as group-led instruction    Resistance Training Performed Yes    VAD Patient? No    PAD/SET Patient? No      Pain Assessment   Currently in Pain? No/denies    Multiple Pain Sites No           Capillary Blood Glucose: No results found for this or any previous visit (from the past 24 hour(s)).    Social History   Tobacco Use  Smoking Status Former Smoker  . Packs/day: 1.50  . Years: 38.00  . Pack years: 57.00  . Types: Cigarettes  . Quit date: 12/24/2020  . Years since quitting: 0.1  Smokeless Tobacco Never Used    Goals Met:  Independence with exercise equipment Exercise tolerated well No report of cardiac concerns or symptoms Strength training completed today  Goals Unmet:  Not Applicable  Comments: check out 1030   Dr. Kathie Dike is Medical Director for [facility] Pulmonary Rehab.

## 2021-02-05 ENCOUNTER — Encounter (HOSPITAL_COMMUNITY): Payer: BC Managed Care – PPO

## 2021-02-08 ENCOUNTER — Encounter (HOSPITAL_COMMUNITY): Payer: BC Managed Care – PPO

## 2021-02-10 ENCOUNTER — Encounter (HOSPITAL_COMMUNITY): Payer: BC Managed Care – PPO

## 2021-02-12 ENCOUNTER — Encounter (HOSPITAL_COMMUNITY): Payer: BC Managed Care – PPO

## 2021-02-15 ENCOUNTER — Encounter (HOSPITAL_COMMUNITY): Admission: RE | Admit: 2021-02-15 | Payer: BC Managed Care – PPO | Source: Ambulatory Visit

## 2021-02-15 ENCOUNTER — Other Ambulatory Visit: Payer: Self-pay

## 2021-02-17 ENCOUNTER — Encounter (HOSPITAL_COMMUNITY): Payer: BC Managed Care – PPO

## 2021-02-19 ENCOUNTER — Ambulatory Visit
Admission: RE | Admit: 2021-02-19 | Discharge: 2021-02-19 | Disposition: A | Discharge status: Home / Self Care | Payer: BC Managed Care – PPO | Source: Ambulatory Visit | Attending: Emergency Medicine | Admitting: Emergency Medicine

## 2021-02-19 ENCOUNTER — Encounter (HOSPITAL_COMMUNITY): Payer: BC Managed Care – PPO

## 2021-02-19 ENCOUNTER — Other Ambulatory Visit: Payer: Self-pay

## 2021-02-19 VITALS — BP 118/76 | HR 81 | Temp 98.7°F | Resp 18

## 2021-02-19 DIAGNOSIS — J069 Acute upper respiratory infection, unspecified: Secondary | ICD-10-CM

## 2021-02-19 DIAGNOSIS — R509 Fever, unspecified: Secondary | ICD-10-CM | POA: Diagnosis not present

## 2021-02-19 MED ORDER — CETIRIZINE HCL 10 MG PO CHEW: 10.0000 mg | CHEWABLE_TABLET | Freq: Every day | ORAL | 0 refills | Status: DC

## 2021-02-19 MED ORDER — FLUTICASONE PROPIONATE 50 MCG/ACT NA SUSP: 2.0000 | Freq: Every day | NASAL | 0 refills | Status: DC

## 2021-02-19 NOTE — ED Provider Notes (Signed)
[facility]   [id number] 02/19/21 Arrival Time: 1057   CC: flu symptoms  SUBJECTIVE: History from: patient.  Ronald Valdez is a 50 y.o. male who presents with sore throat, cough, fever, tmax of 101 this morning, x 2 days.  Admits to sick exposure to COVID, flu or strep.  Has tried OTC medications with relief.  Symptoms are made worse at night.  Reports previous symptoms in the past.   Denies SOB, wheezing, chest pain, nausea, changes in bowel or bladder habits.     ROS: As per HPI.  All other pertinent ROS negative.     Past Medical History:  Diagnosis Date  . Heart attack (HCC) 12/2020  . Hypertension   . Retinitis pigmentosa    Past Surgical History:  Procedure Laterality Date  . CARDIAC CATHETERIZATION Left 12/25/2020   1 stent   . CORONARY/GRAFT ACUTE MI REVASCULARIZATION N/A 12/25/2020   Procedure: Coronary/Graft Acute MI Revascularization;  Surgeon: Marcina Millard, MD;  Location: [facility] INVASIVE [facility];  Service: Cardiovascular;  Laterality: N/A;  . HERNIA REPAIR    . LEFT HEART CATH AND CORONARY ANGIOGRAPHY N/A 12/25/2020   Procedure: LEFT HEART CATH AND CORONARY ANGIOGRAPHY;  Surgeon: Marcina Millard, MD;  Location: [facility];  Service: Cardiovascular;  Laterality: N/A;   No Known Allergies No current facility-administered medications on file prior to encounter.   Current Outpatient Medications on File Prior to Encounter  Medication Sig Dispense Refill  . atorvastatin (LIPITOR) 80 MG tablet Take 1 tablet (80 mg total) by mouth daily at 6 PM. 30 tablet 0  . HYDROcodone-acetaminophen (NORCO/VICODIN) 5-325 MG per tablet Take 1 tablet by mouth every 6 (six) hours as needed for moderate pain (Tooth extraction).    Marland Kitchen losartan (COZAAR) 25 MG tablet Take 25 mg by mouth daily.    . metoprolol tartrate (LOPRESSOR) 25 MG tablet Take 0.5 tablets (12.5 mg total) by mouth 2 (two) times daily. 30 tablet 0  . nitroGLYCERIN (NITROSTAT) 0.4 MG SL tablet  Place 1 tablet (0.4 mg total) under the tongue every 5 (five) minutes as needed for chest pain. 30 tablet 0   Social History   Socioeconomic History  . Marital status: Married    Spouse name: Not on file  . Number of children: 3  . Years of education: Not on file  . Highest education level: GED or equivalent  Occupational History  . Occupation: Leisure centre manager: [employer]    Comment: on short term disability currently  Tobacco Use  . Smoking status: Former Smoker    Packs/day: 1.50    Years: 38.00    Pack years: 57.00    Types: Cigarettes    Quit date: 12/24/2020    Years since quitting: 0.1  . Smokeless tobacco: Never Used  Substance and Sexual Activity  . Alcohol use: No  . Drug use: No  . Sexual activity: Not on file  Other Topics Concern  . Not on file  Social History Narrative  . Not on file   Social Determinants of Health   Financial Resource Strain: Not on file  Food Insecurity: Not on file  Transportation Needs: Not on file  Physical Activity: Not on file  Stress: Not on file  Social Connections: Not on file  Intimate Partner Violence: Not on file   No family history on file.  OBJECTIVE:  Vitals:   02/19/21 1106  BP: 118/76  Pulse: 81  Resp: 18  Temp: 98.7  F (37.1 C)  TempSrc: Oral  SpO2: 95%     General appearance: alert; appears fatigued, but nontoxic; speaking in full sentences and tolerating own secretions HEENT: NCAT; Ears: EACs clear, TMs pearly gray; Eyes: PERRL.  EOM grossly intact.  Nose: nares patent without rhinorrhea, Throat: oropharynx clear, tonsils non erythematous or enlarged, uvula midline  Neck: supple without LAD Lungs: unlabored respirations, symmetrical air entry; cough: mild; no respiratory distress; CTAB Heart: regular rate and rhythm.   Skin: warm and dry Psychological: alert and cooperative; normal mood and affect  ASSESSMENT & PLAN:  1. Fever, unspecified   2. Viral URI with cough      Meds ordered this encounter  Medications  . cetirizine (ZYRTEC) 10 MG chewable tablet    Sig: Chew 1 tablet (10 mg total) by mouth daily.    Dispense:  20 tablet    Refill:  0    Order Specific Question:   Supervising Provider    Answer:   Eustace Moore [[id number]]  . fluticasone (FLONASE) 50 MCG/ACT nasal spray    Sig: Place 2 sprays into both nostrils daily.    Dispense:  16 g    Refill:  0    Order Specific Question:   Supervising Provider    Answer:   Eustace Moore [[id number]]   Flu testing ordered.  It will take between 5-7 days for test results.  Someone will contact you regarding abnormal results.    In the meantime: You should remain isolated in your home for 10 days from symptom onset AND greater than 72 hours after symptoms resolution (absence of fever without the use of fever-reducing medication and improvement in respiratory symptoms), whichever is longer Get plenty of rest and push fluids Use zyrtec for nasal congestion, runny nose, and/or sore throat Use flonase for nasal congestion and runny nose Use medications daily for symptom relief Use OTC medications like ibuprofen or tylenol as needed fever or pain Call or go to the ED if you have any new or worsening symptoms such as fever, worsening cough, shortness of breath, chest tightness, chest pain, turning blue, changes in mental status, etc...   Reviewed expectations re: course of current medical issues. Questions answered. Outlined signs and symptoms indicating need for more acute intervention. Patient verbalized understanding. After Visit Summary given.         Rennis Harding, PA-C 02/19/21 1123

## 2021-02-19 NOTE — ED Triage Notes (Signed)
Sore throat since Wednesday.  Now is having sinus congestion.  Temp was 101 x 2 hours ago, pt took tylenol.

## 2021-02-19 NOTE — Discharge Instructions (Addendum)
Flu testing ordered.  It will take between 5-7 days for test results.  Someone will contact you regarding abnormal results.    In the meantime: You should remain isolated in your home for 10 days from symptom onset AND greater than 72 hours after symptoms resolution (absence of fever without the use of fever-reducing medication and improvement in respiratory symptoms), whichever is longer Get plenty of rest and push fluids Use zyrtec for nasal congestion, runny nose, and/or sore throat Use flonase for nasal congestion and runny nose Use medications daily for symptom relief Use OTC medications like ibuprofen or tylenol as needed fever or pain Call or go to the ED if you have any new or worsening symptoms such as fever, worsening cough, shortness of breath, chest tightness, chest pain, turning blue, changes in mental status, etc..Marland Kitchen

## 2021-02-20 ENCOUNTER — Telehealth: Payer: Self-pay | Admitting: *Deleted

## 2021-02-20 ENCOUNTER — Telehealth: Payer: Self-pay | Admitting: Emergency Medicine

## 2021-02-20 LAB — COVID-19, FLU A+B NAA
Influenza A, NAA: NOT DETECTED
Influenza B, NAA: NOT DETECTED
SARS-CoV-2, NAA: NOT DETECTED

## 2021-02-20 MED ORDER — BENZONATATE 100 MG PO CAPS: 100.0000 mg | ORAL_CAPSULE | Freq: Three times a day (TID) | ORAL | 0 refills | Status: DC

## 2021-02-20 MED ORDER — PREDNISONE 10 MG (21) PO TBPK: ORAL_TABLET | Freq: Every day | ORAL | 0 refills | Status: DC

## 2021-02-20 NOTE — Telephone Encounter (Signed)
Pt left msg with receptionist stating he was up all night coughing and would like an antibiotic.  Reviewed w/ B. Wurst, PA: Tessalon and prednisone Rxs sent to pt's pharmacy; pt's sxs are most likely viral; he should return in 1-2 days if symptoms persist.  Above relayed to pt; pt verbalized understanding and expressed appreciation.

## 2021-02-20 NOTE — Telephone Encounter (Signed)
Persistent cough tessalon and prednisone sent to pharmacy on file

## 2021-02-22 ENCOUNTER — Encounter (HOSPITAL_COMMUNITY): Payer: BC Managed Care – PPO

## 2021-02-22 NOTE — Addendum Note (Signed)
Encounter addended by: Alisia Ferrari on: 02/22/2021 2:01 PM  Actions taken: Flowsheet data copied forward, Flowsheet accepted

## 2021-02-24 ENCOUNTER — Encounter (HOSPITAL_COMMUNITY): Payer: BC Managed Care – PPO

## 2021-02-24 NOTE — Addendum Note (Signed)
Encounter addended by: Suann Larry, RN on: 02/24/2021 9:23 AM  Actions taken: Clinical Note Signed

## 2021-02-24 NOTE — Progress Notes (Signed)
Cardiac Individual Treatment Plan  Patient Details  Name: Ronald Valdez MRN: [id number] Date of Birth: Mar 05, 1971 Referring Provider:   Flowsheet Row CARDIAC REHAB PHASE II ORIENTATION from 01/29/2021 in [facility] CARDIAC REHABILITATION  Referring Provider Dr. Darrold Junker      Initial Encounter Date:  Flowsheet Row CARDIAC REHAB PHASE II ORIENTATION from 01/29/2021 in [place] CARDIAC REHABILITATION  Date 01/29/21      Visit Diagnosis: Status post coronary artery stent placement  Patient's Home Medications on Admission:  Current Outpatient Medications:  .  atorvastatin (LIPITOR) 80 MG tablet, Take 1 tablet (80 mg total) by mouth daily at 6 PM., Disp: 30 tablet, Rfl: 0 .  benzonatate (TESSALON) 100 MG capsule, Take 1 capsule (100 mg total) by mouth every 8 (eight) hours., Disp: 21 capsule, Rfl: 0 .  cetirizine (ZYRTEC) 10 MG chewable tablet, Chew 1 tablet (10 mg total) by mouth daily., Disp: 20 tablet, Rfl: 0 .  fluticasone (FLONASE) 50 MCG/ACT nasal spray, Place 2 sprays into both nostrils daily., Disp: 16 g, Rfl: 0 .  HYDROcodone-acetaminophen (NORCO/VICODIN) 5-325 MG per tablet, Take 1 tablet by mouth every 6 (six) hours as needed for moderate pain (Tooth extraction)., Disp: , Rfl:  .  losartan (COZAAR) 25 MG tablet, Take 25 mg by mouth daily., Disp: , Rfl:  .  metoprolol tartrate (LOPRESSOR) 25 MG tablet, Take 0.5 tablets (12.5 mg total) by mouth 2 (two) times daily., Disp: 30 tablet, Rfl: 0 .  nitroGLYCERIN (NITROSTAT) 0.4 MG SL tablet, Place 1 tablet (0.4 mg total) under the tongue every 5 (five) minutes as needed for chest pain., Disp: 30 tablet, Rfl: 0 .  predniSONE (STERAPRED UNI-PAK 21 TAB) 10 MG (21) TBPK tablet, Take by mouth daily. Take 6 tabs by mouth daily  for 2 days, then 5 tabs for 2 days, then 4 tabs for 2 days, then 3 tabs for 2 days, 2 tabs for 2 days, then 1 tab by mouth daily for 2 days, Disp: 42 tablet, Rfl: 0  Past Medical History: Past Medical History:   Diagnosis Date  . Heart attack (HCC) 12/2020  . Hypertension   . Retinitis pigmentosa     Tobacco Use: Social History   Tobacco Use  Smoking Status Former Smoker  . Packs/day: 1.50  . Years: 38.00  . Pack years: 57.00  . Types: Cigarettes  . Quit date: 12/24/2020  . Years since quitting: 0.1  Smokeless Tobacco Never Used    Labs: Recent Review Flowsheet Data   There is no flowsheet data to display.     Capillary Blood Glucose: Lab Results  Component Value Date   GLUCAP 144 (H) 12/25/2020     Exercise Target Goals: Exercise Program Goal: Individual exercise prescription set using results from initial 6 min walk test and THRR while considering  patient's activity barriers and safety.   Exercise Prescription Goal: Starting with aerobic activity 30 plus minutes a day, 3 days per week for initial exercise prescription. Provide home exercise prescription and guidelines that participant acknowledges understanding prior to discharge.  Activity Barriers & Risk Stratification:  Activity Barriers & Cardiac Risk Stratification - 01/29/21 1248      Activity Barriers & Cardiac Risk Stratification   Activity Barriers Arthritis;Deconditioning;Muscular Weakness;Shortness of Breath;Other (comment)    Comments heel spurs, retina pigmentosa    Cardiac Risk Stratification High           6 Minute Walk:  6 Minute Walk    Row Name 01/29/21 1410  6 Minute Walk   Phase Initial     Distance 1200 feet     Walk Time 6 minutes     # of Rest Breaks 1     MPH 2.27     METS 3.2     RPE 12     Perceived Dyspnea  15     VO2 Peak 11.21     Symptoms Yes (comment)     Comments standing rest due to SOB for 20 seconds half way into the test     Resting HR 65 bpm     Resting BP 124/80     Resting Oxygen Saturation  97 %     Exercise Oxygen Saturation  during 6 min walk 97 %     Max Ex. HR 76 bpm     Max Ex. BP 144/72     2 Minute Post BP 120/70            Oxygen  Initial Assessment:   Oxygen Re-Evaluation:   Oxygen Discharge (Final Oxygen Re-Evaluation):   Initial Exercise Prescription:  Initial Exercise Prescription - 01/29/21 1400      Date of Initial Exercise RX and Referring Provider   Date 01/29/21    Referring Provider Dr. Darrold Junker    Expected Discharge Date 04/23/21      Treadmill   MPH 1    Grade 0    Minutes 17      NuStep   Level 1    SPM 80    Minutes 22      Prescription Details   Frequency (times per week) 3    Duration Progress to 30 minutes of continuous aerobic without signs/symptoms of physical distress      Intensity   THRR 40-80% of Max Heartrate 68-136    Ratings of Perceived Exertion 11-13    Perceived Dyspnea 0-4      Resistance Training   Training Prescription Yes    Weight 3 lbs    Reps 10-15           Perform Capillary Blood Glucose checks as needed.  Exercise Prescription Changes:   Exercise Prescription Changes    Row Name 02/08/21 1300             Response to Exercise   Blood Pressure (Admit) 114/82       Blood Pressure (Exercise) 130/70       Blood Pressure (Exit) 114/72       Heart Rate (Admit) 64 bpm       Heart Rate (Exercise) 90 bpm       Heart Rate (Exit) 72 bpm       Rating of Perceived Exertion (Exercise) 13       Duration Continue with 30 min of aerobic exercise without signs/symptoms of physical distress.       Intensity THRR unchanged               Progression   Progression Continue to progress workloads to maintain intensity without signs/symptoms of physical distress.               Resistance Training   Training Prescription Yes       Weight 3 lbs       Reps 10-15       Time 10 Minutes               Treadmill   MPH 1.5       Grade 0  Minutes 17       METs 2.15               NuStep   Level 1       SPM 70       Minutes 22       METs 1.7              Exercise Comments:   Exercise Comments    Row Name 02/01/21 1034            Exercise Comments Patient completed first exercise session today. He tolerated exercise well overall. He said he was a little short of breath and he got a good workout in today. He had some bigeminy during the strength training part on his EKG. He said he worked hard today and is excited to come back.              Exercise Goals and Review:   Exercise Goals    Row Name 01/29/21 1420 02/22/21 1358           Exercise Goals   Increase Physical Activity Yes Yes      Intervention Provide advice, education, support and counseling about physical activity/exercise needs.;Develop an individualized exercise prescription for aerobic and resistive training based on initial evaluation findings, risk stratification, comorbidities and participant's personal goals. Provide advice, education, support and counseling about physical activity/exercise needs.;Develop an individualized exercise prescription for aerobic and resistive training based on initial evaluation findings, risk stratification, comorbidities and participant's personal goals.      Expected Outcomes Short Term: Attend rehab on a regular basis to increase amount of physical activity.;Long Term: Add in home exercise to make exercise part of routine and to increase amount of physical activity.;Long Term: Exercising regularly at least 3-5 days a week. Short Term: Attend rehab on a regular basis to increase amount of physical activity.;Long Term: Add in home exercise to make exercise part of routine and to increase amount of physical activity.;Long Term: Exercising regularly at least 3-5 days a week.      Increase Strength and Stamina Yes Yes      Intervention Provide advice, education, support and counseling about physical activity/exercise needs.;Develop an individualized exercise prescription for aerobic and resistive training based on initial evaluation findings, risk stratification, comorbidities and participant's personal goals. Provide advice,  education, support and counseling about physical activity/exercise needs.;Develop an individualized exercise prescription for aerobic and resistive training based on initial evaluation findings, risk stratification, comorbidities and participant's personal goals.      Expected Outcomes Short Term: Increase workloads from initial exercise prescription for resistance, speed, and METs.;Short Term: Perform resistance training exercises routinely during rehab and add in resistance training at home;Long Term: Improve cardiorespiratory fitness, muscular endurance and strength as measured by increased METs and functional capacity ( ) Short Term: Increase workloads from initial exercise prescription for resistance, speed, and METs.;Short Term: Perform resistance training exercises routinely during rehab and add in resistance training at home;Long Term: Improve cardiorespiratory fitness, muscular endurance and strength as measured by increased METs and functional capacity ( )      Able to understand and use rate of perceived exertion (RPE) scale Yes Yes      Intervention Provide education and explanation on how to use RPE scale Provide education and explanation on how to use RPE scale      Expected Outcomes Short Term: Able to use RPE daily in rehab to express subjective intensity level;Long Term:  Able to use RPE to guide  intensity level when exercising independently Short Term: Able to use RPE daily in rehab to express subjective intensity level;Long Term:  Able to use RPE to guide intensity level when exercising independently      Knowledge and understanding of Target Heart Rate Range (THRR) Yes Yes      Intervention Provide education and explanation of THRR including how the numbers were predicted and where they are located for reference Provide education and explanation of THRR including how the numbers were predicted and where they are located for reference      Expected Outcomes Short Term: Able to  state/look up THRR;Long Term: Able to use THRR to govern intensity when exercising independently;Short Term: Able to use daily as guideline for intensity in rehab Short Term: Able to state/look up THRR;Long Term: Able to use THRR to govern intensity when exercising independently;Short Term: Able to use daily as guideline for intensity in rehab      Able to check pulse independently Yes Yes      Intervention Provide education and demonstration on how to check pulse in carotid and radial arteries.;Review the importance of being able to check your own pulse for safety during independent exercise Provide education and demonstration on how to check pulse in carotid and radial arteries.;Review the importance of being able to check your own pulse for safety during independent exercise      Expected Outcomes Short Term: Able to explain why pulse checking is important during independent exercise;Long Term: Able to check pulse independently and accurately Short Term: Able to explain why pulse checking is important during independent exercise;Long Term: Able to check pulse independently and accurately      Understanding of Exercise Prescription Yes Yes      Intervention Provide education, explanation, and written materials on patient's individual exercise prescription Provide education, explanation, and written materials on patient's individual exercise prescription      Expected Outcomes Short Term: Able to explain program exercise prescription;Long Term: Able to explain home exercise prescription to exercise independently Short Term: Able to explain program exercise prescription;Long Term: Able to explain home exercise prescription to exercise independently             Exercise Goals Re-Evaluation :  Exercise Goals Re-Evaluation    Row Name 02/22/21 1358             Exercise Goal Re-Evaluation   Exercise Goals Review Increase Physical Activity;Increase Strength and Stamina;Able to understand and use rate  of perceived exertion (RPE) scale;Knowledge and understanding of Target Heart Rate Range (THRR);Able to check pulse independently;Understanding of Exercise Prescription       Comments Pt has attended 3 cardiac rehab sessions. He is currently on hold. He was found to be in ventricular bigeminy during both of his exercise sessions. These strips were faxed to his cardiologist who put him on hold and placed him on a 3-day continuous EKG monitor. He is still waiting the results of this. He is eager to come back to the program and had an optimistic outlook about completing the program. Will continue to monitor and bring back to rehab once he is given clearance by his cardiologist.       Expected Outcomes Through exercise at rehab and at home, the patient will meet their stated goals.               Discharge Exercise Prescription (Final Exercise Prescription Changes):  Exercise Prescription Changes - 02/08/21 1300      Response to Exercise  Blood Pressure (Admit) 114/82    Blood Pressure (Exercise) 130/70    Blood Pressure (Exit) 114/72    Heart Rate (Admit) 64 bpm    Heart Rate (Exercise) 90 bpm    Heart Rate (Exit) 72 bpm    Rating of Perceived Exertion (Exercise) 13    Duration Continue with 30 min of aerobic exercise without signs/symptoms of physical distress.    Intensity THRR unchanged      Progression   Progression Continue to progress workloads to maintain intensity without signs/symptoms of physical distress.      Resistance Training   Training Prescription Yes    Weight 3 lbs    Reps 10-15    Time 10 Minutes      Treadmill   MPH 1.5    Grade 0    Minutes 17    METs 2.15      NuStep   Level 1    SPM 70    Minutes 22    METs 1.7           Nutrition:  Target Goals: Understanding of nutrition guidelines, daily intake of sodium 1500mg , cholesterol 200mg , calories 30% from fat and 7% or less from saturated fats, daily to have 5 or more servings of fruits and  vegetables.  Biometrics:  Pre Biometrics - 01/29/21 1421      Pre Biometrics   Height 5\' 8"  (1.727 m)    Weight 105.6 kg    Waist Circumference 46.5 inches    Hip Circumference 42.5 inches    Waist to Hip Ratio 1.09 %    BMI (Calculated) 35.41    Triceps Skinfold 10 mm    % Body Fat 31.3 %    Grip Strength 22.2 kg    Flexibility 10 in    Single Leg Stand 17.97 seconds            Nutrition Therapy Plan and Nutrition Goals:  Nutrition Therapy & Goals - 02/16/21 1505      Personal Nutrition Goals   Comments Patient's scored 66 on his diet assessment. We provide 2 educational sessions with handouts on heart healthy nutrition and offer RD referral if patient is interested.      Intervention Plan   Intervention Nutrition handout(s) given to patient.           Nutrition Assessments:  Nutrition Assessments - 02/16/21 1504      MEDFICTS Scores   Pre Score 66          MEDIFICTS Score Key:  ?70 Need to make dietary changes   40-70 Heart Healthy Diet  ? 40 Therapeutic Level Cholesterol Diet   Picture Your Plate Scores:  <16<40 Unhealthy dietary pattern with much room for improvement.  41-50 Dietary pattern unlikely to meet recommendations for good health and room for improvement.  51-60 More healthful dietary pattern, with some room for improvement.   >60 Healthy dietary pattern, although there may be some specific behaviors that could be improved.    Nutrition Goals Re-Evaluation:   Nutrition Goals Discharge (Final Nutrition Goals Re-Evaluation):   Psychosocial: Target Goals: Acknowledge presence or absence of significant depression and/or stress, maximize coping skills, provide positive support system. Participant is able to verbalize types and ability to use techniques and skills needed for reducing stress and depression.  Initial Review & Psychosocial Screening:  Initial Psych Review & Screening - 01/29/21 1250      Family Dynamics   Good Support  System? Yes    Comments  His wife is his support system. His two oldest sons support him as well.      Barriers   Psychosocial barriers to participate in program There are no identifiable barriers or psychosocial needs.      Screening Interventions   Interventions Encouraged to exercise    Expected Outcomes Long Term Goal: Stressors or current issues are controlled or eliminated.           Quality of Life Scores:  Quality of Life - 01/29/21 1402      Quality of Life   Select Quality of Life      Quality of Life Scores   Health/Function Pre 15.2 %    Socioeconomic Pre 26.57 %    Psych/Spiritual Pre 15.43 %    Family Pre 28.8 %    GLOBAL Pre 19.59 %          Scores of 19 and below usually indicate a poorer quality of life in these areas.  A difference of  2-3 points is a clinically meaningful difference.  A difference of 2-3 points in the total score of the Quality of Life Index has been associated with significant improvement in overall quality of life, self-image, physical symptoms, and general health in studies assessing change in quality of life.  PHQ-9: Recent Review Flowsheet Data    Depression screen [facility] 2/9 01/29/2021   Decreased Interest 0   Down, Depressed, Hopeless 0   PHQ - 2 Score 0   Altered sleeping 3   Tired, decreased energy 3   Change in appetite 0   Feeling bad or failure about yourself  0   Trouble concentrating 0   Moving slowly or fidgety/restless 0   Suicidal thoughts 0   PHQ-9 Score 6   Difficult doing work/chores Very difficult      Interpretation of Total Score  Total Score Depression Severity:  1-4 = Minimal depression, 5-9 = Mild depression, 10-14 = Moderate depression, 15-19 = Moderately severe depression, 20-27 = Severe depression   Psychosocial Evaluation and Intervention:  Psychosocial Evaluation - 01/29/21 1403      Psychosocial Evaluation & Interventions   Interventions Encouraged to exercise with the program and follow exercise  prescription    Comments Pt has no identifiable barriers to completing cardiac rehab. He is concerned about the amount of SOB that he has experienced with exertion since his STEMI. THis has impacted his ability to work and do things for his family. His QOL was a 19.59 and PHQ-9 was a 6. All of this is associated with SOB and lack of energy. 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 He does cope well with his problems and supports that he has a good support system between his wife and his two oldest sons. He quit smoking the day he was admitted to the hosptial with his STEMI. He reports that he has not relapsed and has not really thought about smoking. His goals for the program are to lose some weight and to decrease his SOB with activities. He is hopeful that this program will get im back to his previous functioning.    Expected Outcomes The patient's SOB will be decreased and this will resolve some of the stress that he is experiencing.    Continue Psychosocial Services  No Follow  up required           Psychosocial Re-Evaluation:  Psychosocial Re-Evaluation    Row Name 02/16/21 1502             Psychosocial Re-Evaluation   Current issues with None Identified       Comments Patient is new to the program completing 3 sessions. He continues to have no psychosocial issues identified. He CR is on hold presently due to palpitaions with frequent PVC's. Will continue to monitor.       Expected Outcomes Patient will continue to have no psychosocial issues identified.       Interventions Stress management education;Encouraged to attend Cardiac Rehabilitation for the exercise;Relaxation education       Continue Psychosocial Services  No Follow up required              Psychosocial Discharge (Final  Psychosocial Re-Evaluation):  Psychosocial Re-Evaluation - 02/16/21 1502      Psychosocial Re-Evaluation   Current issues with None Identified    Comments Patient is new to the program completing 3 sessions. He continues to have no psychosocial issues identified. He CR is on hold presently due to palpitaions with frequent PVC's. Will continue to monitor.    Expected Outcomes Patient will continue to have no psychosocial issues identified.    Interventions Stress management education;Encouraged to attend Cardiac Rehabilitation for the exercise;Relaxation education    Continue Psychosocial Services  No Follow up required           Vocational Rehabilitation: Provide vocational rehab assistance to qualifying candidates.   Vocational Rehab Evaluation & Intervention:  Vocational Rehab - 01/29/21 1257      Initial Vocational Rehab Evaluation & Intervention   Assessment shows need for Vocational Rehabilitation No      Vocational Rehab Re-Evaulation   Comments He thinks that cardiac rehab will help him enough to get back to work.           Education: Education Goals: Education classes will be provided on a weekly basis, covering required topics. Participant will state understanding/return demonstration of topics presented.  Learning Barriers/Preferences:  Learning Barriers/Preferences - 01/29/21 1256      Learning Barriers/Preferences   Learning Barriers Sight    Learning Preferences Pictoral;Skilled Demonstration           Education Topics: Hypertension, Hypertension Reduction -Define heart disease and high blood pressure. Discus how high blood pressure affects the body and ways to reduce high blood pressure.   Exercise and Your Heart -Discuss why it is important to exercise, the FITT principles of exercise, normal and abnormal responses to exercise, and how to exercise safely. Flowsheet Row CARDIAC REHAB PHASE II EXERCISE from 02/03/2021 in [place] CARDIAC REHABILITATION   Date 02/03/21  Educator mk  Instruction Review Code 2- Demonstrated Understanding      Angina -Discuss definition of angina, causes of angina, treatment of angina, and how to decrease risk of having angina.   Cardiac Medications -Review what the following cardiac medications are used for, how they affect the body, and side effects that may occur when taking the medications.  Medications include Aspirin, Beta blockers, calcium channel blockers, ACE Inhibitors, angiotensin receptor blockers, diuretics, digoxin, and antihyperlipidemics.   Congestive Heart Failure -Discuss the definition of CHF, how to live with CHF, the signs and symptoms of CHF, and how keep track of weight and sodium intake.   Heart Disease and Intimacy -Discus the effect sexual activity has on the heart, how changes occur  during intimacy as we age, and safety during sexual activity.   Smoking Cessation / COPD -Discuss different methods to quit smoking, the health benefits of quitting smoking, and the definition of COPD.   Nutrition I: Fats -Discuss the types of cholesterol, what cholesterol does to the heart, and how cholesterol levels can be controlled.   Nutrition II: Labels -Discuss the different components of food labels and how to read food label   Heart Parts/Heart Disease and PAD -Discuss the anatomy of the heart, the pathway of blood circulation through the heart, and these are affected by heart disease.   Stress I: Signs and Symptoms -Discuss the causes of stress, how stress may lead to anxiety and depression, and ways to limit stress.   Stress II: Relaxation -Discuss different types of relaxation techniques to limit stress.   Warning Signs of Stroke / TIA -Discuss definition of a stroke, what the signs and symptoms are of a stroke, and how to identify when someone is having stroke.   Knowledge Questionnaire Score:  Knowledge Questionnaire Score - 01/29/21 1256      Knowledge  Questionnaire Score   Pre Score 22/24           Core Components/Risk Factors/Patient Goals at Admission:  Personal Goals and Risk Factors at Admission - 01/29/21 1301      Core Components/Risk Factors/Patient Goals on Admission    Weight Management Yes;Obesity;Weight Loss    Intervention Weight Management: Develop a combined nutrition and exercise program designed to reach desired caloric intake, while maintaining appropriate intake of nutrient and fiber, sodium and fats, and appropriate energy expenditure required for the weight goal.;Weight Management: Provide education and appropriate resources to help participant work on and attain dietary goals.;Weight Management/Obesity: Establish reasonable short term and long term weight goals.;Obesity: Provide education and appropriate resources to help participant work on and attain dietary goals.    Admit Weight 227 lb (103 kg)    Goal Weight: Short Term 210 lb (95.3 kg)    Expected Outcomes Short Term: Continue to assess and modify interventions until short term weight is achieved;Long Term: Adherence to nutrition and physical activity/exercise program aimed toward attainment of established weight goal;Weight Maintenance: Understanding of the daily nutrition guidelines, which includes 25-35% calories from fat, 7% or less cal from saturated fats, less than 200mg  cholesterol, less than 1.5gm of sodium, & 5 or more servings of fruits and vegetables daily;Weight Loss: Understanding of general recommendations for a balanced deficit meal plan, which promotes 1-2 lb weight loss per week and includes a negative energy balance of [phone] kcal/d;Understanding recommendations for meals to include 15-35% energy as protein, 25-35% energy from fat, 35-60% energy from carbohydrates, less than 200mg  of dietary cholesterol, 20-35 gm of total fiber daily;Understanding of distribution of calorie intake throughout the day with the consumption of 4-5 meals/snacks    Improve  shortness of breath with ADL's Yes    Intervention Provide education, individualized exercise plan and daily activity instruction to help decrease symptoms of SOB with activities of daily living.    Expected Outcomes Short Term: Improve cardiorespiratory fitness to achieve a reduction of symptoms when performing ADLs;Long Term: Be able to perform more ADLs without symptoms or delay the onset of symptoms           Core Components/Risk Factors/Patient Goals Review:   Goals and Risk Factor Review    Row Name 02/16/21 1506             Core Components/Risk Factors/Patient Goals Review  Personal Goals Review Weight Management/Obesity;Improve shortness of breath with ADL's       Review Patient is new to the program completing 3 sessions. He was referred to CR with STEMI and Stent placement. He has multiple risk factors for CAD and is participating in the program for risk modification. His cardiologist was notified on 02/04/21 due to frequent PVC's with bigeminy at times. MD put CR on hold, increased his metoprolol to 25 mg BID and ordered a 72 hour holter monitor with no results documented. Patient's personal goals for the program are to get stronger; breathe better; be able to do his ADL's and lose weight. We will continue to monitor his progress as he works toward meeting these goals.       Expected Outcomes Patient will complete the program meeting both personal and program goals.              Core Components/Risk Factors/Patient Goals at Discharge (Final Review):   Goals and Risk Factor Review - 02/16/21 1506      Core Components/Risk Factors/Patient Goals Review   Personal Goals Review Weight Management/Obesity;Improve shortness of breath with ADL's    Review Patient is new to the program completing 3 sessions. He was referred to CR with STEMI and Stent placement. He has multiple risk factors for CAD and is participating in the program for risk modification. His cardiologist was notified  on 02/04/21 due to frequent PVC's with bigeminy at times. MD put CR on hold, increased his metoprolol to 25 mg BID and ordered a 72 hour holter monitor with no results documented. Patient's personal goals for the program are to get stronger; breathe better; be able to do his ADL's and lose weight. We will continue to monitor his progress as he works toward meeting these goals.    Expected Outcomes Patient will complete the program meeting both personal and program goals.           ITP Comments:   Comments: ITP REVIEW Pt is making expected progress toward Cardiac Rehab goals after completing 3 sessions. Patient currently on hold due to frequent PVC's noted on CR. Cardiologist notified and ordered a 72 hr monitor. No results yet. Will continue to monitor.

## 2021-02-25 DIAGNOSIS — J069 Acute upper respiratory infection, unspecified: Secondary | ICD-10-CM | POA: Diagnosis not present

## 2021-02-25 DIAGNOSIS — J019 Acute sinusitis, unspecified: Secondary | ICD-10-CM | POA: Diagnosis not present

## 2021-02-25 DIAGNOSIS — Z20822 Contact with and (suspected) exposure to covid-19: Secondary | ICD-10-CM | POA: Diagnosis not present

## 2021-02-26 ENCOUNTER — Encounter (HOSPITAL_COMMUNITY): Payer: BC Managed Care – PPO

## 2021-03-01 ENCOUNTER — Encounter (HOSPITAL_COMMUNITY): Payer: BC Managed Care – PPO

## 2021-03-03 ENCOUNTER — Encounter (HOSPITAL_COMMUNITY): Payer: BC Managed Care – PPO

## 2021-03-05 ENCOUNTER — Encounter (HOSPITAL_COMMUNITY): Payer: BC Managed Care – PPO

## 2021-03-08 ENCOUNTER — Encounter (HOSPITAL_COMMUNITY): Payer: BC Managed Care – PPO

## 2021-03-09 DIAGNOSIS — I2510 Atherosclerotic heart disease of native coronary artery without angina pectoris: Secondary | ICD-10-CM | POA: Diagnosis not present

## 2021-03-09 DIAGNOSIS — E7849 Other hyperlipidemia: Secondary | ICD-10-CM | POA: Diagnosis not present

## 2021-03-09 DIAGNOSIS — E785 Hyperlipidemia, unspecified: Secondary | ICD-10-CM | POA: Diagnosis not present

## 2021-03-09 DIAGNOSIS — R42 Dizziness and giddiness: Secondary | ICD-10-CM | POA: Diagnosis not present

## 2021-03-09 DIAGNOSIS — I739 Peripheral vascular disease, unspecified: Secondary | ICD-10-CM | POA: Diagnosis not present

## 2021-03-10 ENCOUNTER — Encounter (HOSPITAL_COMMUNITY): Payer: BC Managed Care – PPO

## 2021-03-12 ENCOUNTER — Encounter (HOSPITAL_COMMUNITY): Payer: BC Managed Care – PPO

## 2021-03-12 NOTE — Addendum Note (Signed)
Encounter addended by: Suann Larry, RN on: 03/12/2021 9:59 AM  Actions taken: Flowsheet data copied forward, Flowsheet accepted

## 2021-03-15 ENCOUNTER — Encounter (HOSPITAL_COMMUNITY): Payer: BC Managed Care – PPO

## 2021-03-15 DIAGNOSIS — I10 Essential (primary) hypertension: Secondary | ICD-10-CM | POA: Diagnosis not present

## 2021-03-15 DIAGNOSIS — Z955 Presence of coronary angioplasty implant and graft: Secondary | ICD-10-CM | POA: Diagnosis not present

## 2021-03-15 DIAGNOSIS — I2119 ST elevation (STEMI) myocardial infarction involving other coronary artery of inferior wall: Secondary | ICD-10-CM | POA: Diagnosis not present

## 2021-03-16 DIAGNOSIS — R5383 Other fatigue: Secondary | ICD-10-CM | POA: Diagnosis not present

## 2021-03-16 DIAGNOSIS — I2119 ST elevation (STEMI) myocardial infarction involving other coronary artery of inferior wall: Secondary | ICD-10-CM | POA: Diagnosis not present

## 2021-03-16 DIAGNOSIS — I10 Essential (primary) hypertension: Secondary | ICD-10-CM | POA: Diagnosis not present

## 2021-03-16 DIAGNOSIS — Z955 Presence of coronary angioplasty implant and graft: Secondary | ICD-10-CM | POA: Diagnosis not present

## 2021-03-17 ENCOUNTER — Encounter (HOSPITAL_COMMUNITY): Payer: BC Managed Care – PPO

## 2021-03-19 ENCOUNTER — Encounter (HOSPITAL_COMMUNITY): Payer: BC Managed Care – PPO

## 2021-03-22 ENCOUNTER — Encounter (HOSPITAL_COMMUNITY): Payer: BC Managed Care – PPO

## 2021-03-23 NOTE — Addendum Note (Signed)
Encounter addended by: Carlis Stable on: 03/23/2021 10:31 AM  Actions taken: Flowsheet data copied forward, Flowsheet accepted

## 2021-03-24 ENCOUNTER — Encounter (HOSPITAL_COMMUNITY): Payer: BC Managed Care – PPO

## 2021-03-25 NOTE — Progress Notes (Signed)
Cardiac Individual Treatment Plan  Patient Details  Name: Ronald Valdez MRN: [id number] Date of Birth: 10/18/1971 Referring Provider:   Flowsheet Row CARDIAC REHAB PHASE II ORIENTATION from 01/29/2021 in [facility] CARDIAC REHABILITATION  Referring Provider Dr. Darrold Junker      Initial Encounter Date:  Flowsheet Row CARDIAC REHAB PHASE II ORIENTATION from 01/29/2021 in [place] CARDIAC REHABILITATION  Date 01/29/21      Visit Diagnosis: Status post coronary artery stent placement  Patient's Home Medications on Admission:  Current Outpatient Medications:  .  atorvastatin (LIPITOR) 80 MG tablet, Take 1 tablet (80 mg total) by mouth daily at 6 PM., Disp: 30 tablet, Rfl: 0 .  benzonatate (TESSALON) 100 MG capsule, Take 1 capsule (100 mg total) by mouth every 8 (eight) hours., Disp: 21 capsule, Rfl: 0 .  cetirizine (ZYRTEC) 10 MG chewable tablet, Chew 1 tablet (10 mg total) by mouth daily., Disp: 20 tablet, Rfl: 0 .  fluticasone (FLONASE) 50 MCG/ACT nasal spray, Place 2 sprays into both nostrils daily., Disp: 16 g, Rfl: 0 .  HYDROcodone-acetaminophen (NORCO/VICODIN) 5-325 MG per tablet, Take 1 tablet by mouth every 6 (six) hours as needed for moderate pain (Tooth extraction)., Disp: , Rfl:  .  losartan (COZAAR) 25 MG tablet, Take 25 mg by mouth daily., Disp: , Rfl:  .  metoprolol tartrate (LOPRESSOR) 25 MG tablet, Take 0.5 tablets (12.5 mg total) by mouth 2 (two) times daily., Disp: 30 tablet, Rfl: 0 .  nitroGLYCERIN (NITROSTAT) 0.4 MG SL tablet, Place 1 tablet (0.4 mg total) under the tongue every 5 (five) minutes as needed for chest pain., Disp: 30 tablet, Rfl: 0 .  predniSONE (STERAPRED UNI-PAK 21 TAB) 10 MG (21) TBPK tablet, Take by mouth daily. Take 6 tabs by mouth daily  for 2 days, then 5 tabs for 2 days, then 4 tabs for 2 days, then 3 tabs for 2 days, 2 tabs for 2 days, then 1 tab by mouth daily for 2 days, Disp: 42 tablet, Rfl: 0  Past Medical History: Past Medical History:   Diagnosis Date  . Heart attack (HCC) 12/2020  . Hypertension   . Retinitis pigmentosa     Tobacco Use: Social History   Tobacco Use  Smoking Status Former Smoker  . Packs/day: 1.50  . Years: 38.00  . Pack years: 57.00  . Types: Cigarettes  . Quit date: 12/24/2020  . Years since quitting: 0.2  Smokeless Tobacco Never Used    Labs: Recent Review Flowsheet Data   There is no flowsheet data to display.     Capillary Blood Glucose: Lab Results  Component Value Date   GLUCAP 144 (H) 12/25/2020     Exercise Target Goals: Exercise Program Goal: Individual exercise prescription set using results from initial 6 min walk test and THRR while considering  patient's activity barriers and safety.   Exercise Prescription Goal: Starting with aerobic activity 30 plus minutes a day, 3 days per week for initial exercise prescription. Provide home exercise prescription and guidelines that participant acknowledges understanding prior to discharge.  Activity Barriers & Risk Stratification:  Activity Barriers & Cardiac Risk Stratification - 01/29/21 1248      Activity Barriers & Cardiac Risk Stratification   Activity Barriers Arthritis;Deconditioning;Muscular Weakness;Shortness of Breath;Other (comment)    Comments heel spurs, retina pigmentosa    Cardiac Risk Stratification High           6 Minute Walk:  6 Minute Walk    Row Name 01/29/21 1410  6 Minute Walk   Phase Initial     Distance 1200 feet     Walk Time 6 minutes     # of Rest Breaks 1     MPH 2.27     METS 3.2     RPE 12     Perceived Dyspnea  15     VO2 Peak 11.21     Symptoms Yes (comment)     Comments standing rest due to SOB for 20 seconds half way into the test     Resting HR 65 bpm     Resting BP 124/80     Resting Oxygen Saturation  97 %     Exercise Oxygen Saturation  during 6 min walk 97 %     Max Ex. HR 76 bpm     Max Ex. BP 144/72     2 Minute Post BP 120/70            Oxygen  Initial Assessment:   Oxygen Re-Evaluation:   Oxygen Discharge (Final Oxygen Re-Evaluation):   Initial Exercise Prescription:  Initial Exercise Prescription - 01/29/21 1400      Date of Initial Exercise RX and Referring Provider   Date 01/29/21    Referring Provider Dr. Darrold Junker    Expected Discharge Date 04/23/21      Treadmill   MPH 1    Grade 0    Minutes 17      NuStep   Level 1    SPM 80    Minutes 22      Prescription Details   Frequency (times per week) 3    Duration Progress to 30 minutes of continuous aerobic without signs/symptoms of physical distress      Intensity   THRR 40-80% of Max Heartrate 68-136    Ratings of Perceived Exertion 11-13    Perceived Dyspnea 0-4      Resistance Training   Training Prescription Yes    Weight 3 lbs    Reps 10-15           Perform Capillary Blood Glucose checks as needed.  Exercise Prescription Changes:  Exercise Prescription Changes    Row Name 02/08/21 1300             Response to Exercise   Blood Pressure (Admit) 114/82       Blood Pressure (Exercise) 130/70       Blood Pressure (Exit) 114/72       Heart Rate (Admit) 64 bpm       Heart Rate (Exercise) 90 bpm       Heart Rate (Exit) 72 bpm       Rating of Perceived Exertion (Exercise) 13       Duration Continue with 30 min of aerobic exercise without signs/symptoms of physical distress.       Intensity THRR unchanged               Progression   Progression Continue to progress workloads to maintain intensity without signs/symptoms of physical distress.               Resistance Training   Training Prescription Yes       Weight 3 lbs       Reps 10-15       Time 10 Minutes               Treadmill   MPH 1.5       Grade 0  Minutes 17       METs 2.15               NuStep   Level 1       SPM 70       Minutes 22       METs 1.7              Exercise Comments:  Exercise Comments    Row Name 02/01/21 1034           Exercise  Comments Patient completed first exercise session today. He tolerated exercise well overall. He said he was a little short of breath and he got a good workout in today. He had some bigeminy during the strength training part on his EKG. He said he worked hard today and is excited to come back.              Exercise Goals and Review:  Exercise Goals    Row Name 01/29/21 1420 02/22/21 1358 03/23/21 1030         Exercise Goals   Increase Physical Activity Yes Yes Yes     Intervention Provide advice, education, support and counseling about physical activity/exercise needs.;Develop an individualized exercise prescription for aerobic and resistive training based on initial evaluation findings, risk stratification, comorbidities and participant's personal goals. Provide advice, education, support and counseling about physical activity/exercise needs.;Develop an individualized exercise prescription for aerobic and resistive training based on initial evaluation findings, risk stratification, comorbidities and participant's personal goals. Provide advice, education, support and counseling about physical activity/exercise needs.;Develop an individualized exercise prescription for aerobic and resistive training based on initial evaluation findings, risk stratification, comorbidities and participant's personal goals.     Expected Outcomes Short Term: Attend rehab on a regular basis to increase amount of physical activity.;Long Term: Add in home exercise to make exercise part of routine and to increase amount of physical activity.;Long Term: Exercising regularly at least 3-5 days a week. Short Term: Attend rehab on a regular basis to increase amount of physical activity.;Long Term: Add in home exercise to make exercise part of routine and to increase amount of physical activity.;Long Term: Exercising regularly at least 3-5 days a week. Short Term: Attend rehab on a regular basis to increase amount of physical  activity.;Long Term: Add in home exercise to make exercise part of routine and to increase amount of physical activity.;Long Term: Exercising regularly at least 3-5 days a week.     Increase Strength and Stamina Yes Yes Yes     Intervention Provide advice, education, support and counseling about physical activity/exercise needs.;Develop an individualized exercise prescription for aerobic and resistive training based on initial evaluation findings, risk stratification, comorbidities and participant's personal goals. Provide advice, education, support and counseling about physical activity/exercise needs.;Develop an individualized exercise prescription for aerobic and resistive training based on initial evaluation findings, risk stratification, comorbidities and participant's personal goals. Provide advice, education, support and counseling about physical activity/exercise needs.;Develop an individualized exercise prescription for aerobic and resistive training based on initial evaluation findings, risk stratification, comorbidities and participant's personal goals.     Expected Outcomes Short Term: Increase workloads from initial exercise prescription for resistance, speed, and METs.;Short Term: Perform resistance training exercises routinely during rehab and add in resistance training at home;Long Term: Improve cardiorespiratory fitness, muscular endurance and strength as measured by increased METs and functional capacity ( ) Short Term: Increase workloads from initial exercise prescription for resistance, speed, and METs.;Short Term: Perform resistance training exercises routinely during  rehab and add in resistance training at home;Long Term: Improve cardiorespiratory fitness, muscular endurance and strength as measured by increased METs and functional capacity ( ) Short Term: Increase workloads from initial exercise prescription for resistance, speed, and METs.;Short Term: Perform resistance training  exercises routinely during rehab and add in resistance training at home;Long Term: Improve cardiorespiratory fitness, muscular endurance and strength as measured by increased METs and functional capacity ( )     Able to understand and use rate of perceived exertion (RPE) scale Yes Yes Yes     Intervention Provide education and explanation on how to use RPE scale Provide education and explanation on how to use RPE scale Provide education and explanation on how to use RPE scale     Expected Outcomes Short Term: Able to use RPE daily in rehab to express subjective intensity level;Long Term:  Able to use RPE to guide intensity level when exercising independently Short Term: Able to use RPE daily in rehab to express subjective intensity level;Long Term:  Able to use RPE to guide intensity level when exercising independently Short Term: Able to use RPE daily in rehab to express subjective intensity level;Long Term:  Able to use RPE to guide intensity level when exercising independently     Knowledge and understanding of Target Heart Rate Range (THRR) Yes Yes Yes     Intervention Provide education and explanation of THRR including how the numbers were predicted and where they are located for reference Provide education and explanation of THRR including how the numbers were predicted and where they are located for reference Provide education and explanation of THRR including how the numbers were predicted and where they are located for reference     Expected Outcomes Short Term: Able to state/look up THRR;Long Term: Able to use THRR to govern intensity when exercising independently;Short Term: Able to use daily as guideline for intensity in rehab Short Term: Able to state/look up THRR;Long Term: Able to use THRR to govern intensity when exercising independently;Short Term: Able to use daily as guideline for intensity in rehab Short Term: Able to state/look up THRR;Long Term: Able to use THRR to govern intensity  when exercising independently;Short Term: Able to use daily as guideline for intensity in rehab     Able to check pulse independently Yes Yes Yes     Intervention Provide education and demonstration on how to check pulse in carotid and radial arteries.;Review the importance of being able to check your own pulse for safety during independent exercise Provide education and demonstration on how to check pulse in carotid and radial arteries.;Review the importance of being able to check your own pulse for safety during independent exercise Provide education and demonstration on how to check pulse in carotid and radial arteries.;Review the importance of being able to check your own pulse for safety during independent exercise     Expected Outcomes Short Term: Able to explain why pulse checking is important during independent exercise;Long Term: Able to check pulse independently and accurately Short Term: Able to explain why pulse checking is important during independent exercise;Long Term: Able to check pulse independently and accurately Short Term: Able to explain why pulse checking is important during independent exercise;Long Term: Able to check pulse independently and accurately     Understanding of Exercise Prescription Yes Yes Yes     Intervention Provide education, explanation, and written materials on patient's individual exercise prescription Provide education, explanation, and written materials on patient's individual exercise prescription Provide education, explanation, and written materials  on patient's individual exercise prescription     Expected Outcomes Short Term: Able to explain program exercise prescription;Long Term: Able to explain home exercise prescription to exercise independently Short Term: Able to explain program exercise prescription;Long Term: Able to explain home exercise prescription to exercise independently Short Term: Able to explain program exercise prescription;Long Term: Able to  explain home exercise prescription to exercise independently            Exercise Goals Re-Evaluation :  Exercise Goals Re-Evaluation    Row Name 02/22/21 1358 03/23/21 1030           Exercise Goal Re-Evaluation   Exercise Goals Review Increase Physical Activity;Increase Strength and Stamina;Able to understand and use rate of perceived exertion (RPE) scale;Knowledge and understanding of Target Heart Rate Range (THRR);Able to check pulse independently;Understanding of Exercise Prescription Increase Physical Activity;Increase Strength and Stamina;Able to understand and use rate of perceived exertion (RPE) scale;Knowledge and understanding of Target Heart Rate Range (THRR);Able to check pulse independently;Understanding of Exercise Prescription      Comments Pt has attended 3 cardiac rehab sessions. He is currently on hold. He was found to be in ventricular bigeminy during both of his exercise sessions. These strips were faxed to his cardiologist who put him on hold and placed him on a 3-day continuous EKG monitor. He is still waiting the results of this. He is eager to come back to the program and had an optimistic outlook about completing the program. Will continue to monitor and bring back to rehab once he is given clearance by his cardiologist. Patient has not returned to cardiac rehab yet. Still wating on his EKG monitor results.      Expected Outcomes Through exercise at rehab and at home, the patient will meet their stated goals. Through exercise at rehab and at home, the patient will meet their stated goals.              Discharge Exercise Prescription (Final Exercise Prescription Changes):  Exercise Prescription Changes - 02/08/21 1300      Response to Exercise   Blood Pressure (Admit) 114/82    Blood Pressure (Exercise) 130/70    Blood Pressure (Exit) 114/72    Heart Rate (Admit) 64 bpm    Heart Rate (Exercise) 90 bpm    Heart Rate (Exit) 72 bpm    Rating of Perceived Exertion  (Exercise) 13    Duration Continue with 30 min of aerobic exercise without signs/symptoms of physical distress.    Intensity THRR unchanged      Progression   Progression Continue to progress workloads to maintain intensity without signs/symptoms of physical distress.      Resistance Training   Training Prescription Yes    Weight 3 lbs    Reps 10-15    Time 10 Minutes      Treadmill   MPH 1.5    Grade 0    Minutes 17    METs 2.15      NuStep   Level 1    SPM 70    Minutes 22    METs 1.7           Nutrition:  Target Goals: Understanding of nutrition guidelines, daily intake of sodium 1500mg , cholesterol 200mg , calories 30% from fat and 7% or less from saturated fats, daily to have 5 or more servings of fruits and vegetables.  Biometrics:  Pre Biometrics - 01/29/21 1421      Pre Biometrics   Height 5\' 8"  (1.727 m)  Weight 105.6 kg    Waist Circumference 46.5 inches    Hip Circumference 42.5 inches    Waist to Hip Ratio 1.09 %    BMI (Calculated) 35.41    Triceps Skinfold 10 mm    % Body Fat 31.3 %    Grip Strength 22.2 kg    Flexibility 10 in    Single Leg Stand 17.97 seconds            Nutrition Therapy Plan and Nutrition Goals:  Nutrition Therapy & Goals - 02/16/21 1505      Personal Nutrition Goals   Comments Patient's scored 66 on his diet assessment. We provide 2 educational sessions with handouts on heart healthy nutrition and offer RD referral if patient is interested.      Intervention Plan   Intervention Nutrition handout(s) given to patient.           Nutrition Assessments:  Nutrition Assessments - 02/16/21 1504      MEDFICTS Scores   Pre Score 66          MEDIFICTS Score Key:  ?70 Need to make dietary changes   40-70 Heart Healthy Diet  ? 40 Therapeutic Level Cholesterol Diet   Picture Your Plate Scores:  <79 Unhealthy dietary pattern with much room for improvement.  41-50 Dietary pattern unlikely to meet  recommendations for good health and room for improvement.  51-60 More healthful dietary pattern, with some room for improvement.   >60 Healthy dietary pattern, although there may be some specific behaviors that could be improved.    Nutrition Goals Re-Evaluation:   Nutrition Goals Discharge (Final Nutrition Goals Re-Evaluation):   Psychosocial: Target Goals: Acknowledge presence or absence of significant depression and/or stress, maximize coping skills, provide positive support system. Participant is able to verbalize types and ability to use techniques and skills needed for reducing stress and depression.  Initial Review & Psychosocial Screening:  Initial Psych Review & Screening - 01/29/21 1250      Family Dynamics   Good Support System? Yes    Comments His wife is his support system. His two oldest sons support him as well.      Barriers   Psychosocial barriers to participate in program There are no identifiable barriers or psychosocial needs.      Screening Interventions   Interventions Encouraged to exercise    Expected Outcomes Long Term Goal: Stressors or current issues are controlled or eliminated.           Quality of Life Scores:  Quality of Life - 01/29/21 1402      Quality of Life   Select Quality of Life      Quality of Life Scores   Health/Function Pre 15.2 %    Socioeconomic Pre 26.57 %    Psych/Spiritual Pre 15.43 %    Family Pre 28.8 %    GLOBAL Pre 19.59 %          Scores of 19 and below usually indicate a poorer quality of life in these areas.  A difference of  2-3 points is a clinically meaningful difference.  A difference of 2-3 points in the total score of the Quality of Life Index has been associated with significant improvement in overall quality of life, self-image, physical symptoms, and general health in studies assessing change in quality of life.  PHQ-9: Recent Review Flowsheet Data    Depression screen [facility] 2/9 01/29/2021   Decreased  Interest 0   Down, Depressed, Hopeless 0  PHQ - 2 Score 0   Altered sleeping 3   Tired, decreased energy 3   Change in appetite 0   Feeling bad or failure about yourself  0   Trouble concentrating 0   Moving slowly or fidgety/restless 0   Suicidal thoughts 0   PHQ-9 Score 6   Difficult doing work/chores Very difficult      Interpretation of Total Score  Total Score Depression Severity:  1-4 = Minimal depression, 5-9 = Mild depression, 10-14 = Moderate depression, 15-19 = Moderately severe depression, 20-27 = Severe depression   Psychosocial Evaluation and Intervention:  Psychosocial Evaluation - 01/29/21 1403      Psychosocial Evaluation & Interventions   Interventions Encouraged to exercise with the program and follow exercise prescription    Comments Pt has no identifiable barriers to completing cardiac rehab. He is concerned about the amount of SOB that he has experienced with exertion since his STEMI. THis has impacted his ability to work and do things for his family. His QOL was a 19.59 and PHQ-9 was a 6. All of this is associated with SOB and lack of energy. 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 He does cope well with his problems and supports that he has a good support system between his wife and his two oldest sons. He quit smoking the day he was admitted to the hosptial with his STEMI. He reports that he has not relapsed and has not really thought about smoking. His goals for the program are to lose some weight and to decrease his SOB with activities. He is hopeful that this program will get im back to his previous functioning.    Expected Outcomes The patient's SOB will be decreased and this will resolve some of the stress that he is experiencing.    Continue Psychosocial Services  No  Follow up required           Psychosocial Re-Evaluation:  Psychosocial Re-Evaluation    Row Name 02/16/21 1502             Psychosocial Re-Evaluation   Current issues with None Identified       Comments Patient is new to the program completing 3 sessions. He continues to have no psychosocial issues identified. He CR is on hold presently due to palpitaions with frequent PVC's. Will continue to monitor.       Expected Outcomes Patient will continue to have no psychosocial issues identified.       Interventions Stress management education;Encouraged to attend Cardiac Rehabilitation for the exercise;Relaxation education       Continue Psychosocial Services  No Follow up required              Psychosocial Discharge (Final Psychosocial Re-Evaluation):  Psychosocial Re-Evaluation - 02/16/21 1502      Psychosocial Re-Evaluation   Current issues with None Identified    Comments Patient is new to the program completing 3 sessions. He continues to have no psychosocial issues identified. He CR is on hold presently due to palpitaions with frequent PVC's. Will continue to monitor.    Expected Outcomes Patient will continue to have no psychosocial issues identified.    Interventions Stress management education;Encouraged to attend Cardiac Rehabilitation for the exercise;Relaxation education    Continue Psychosocial Services  No Follow  up required           Vocational Rehabilitation: Provide vocational rehab assistance to qualifying candidates.   Vocational Rehab Evaluation & Intervention:  Vocational Rehab - 01/29/21 1257      Initial Vocational Rehab Evaluation & Intervention   Assessment shows need for Vocational Rehabilitation No      Vocational Rehab Re-Evaulation   Comments He thinks that cardiac rehab will help him enough to get back to work.           Education: Education Goals: Education classes will be provided on a weekly basis, covering required topics. Participant  will state understanding/return demonstration of topics presented.  Learning Barriers/Preferences:  Learning Barriers/Preferences - 01/29/21 1256      Learning Barriers/Preferences   Learning Barriers Sight    Learning Preferences Pictoral;Skilled Demonstration           Education Topics: Hypertension, Hypertension Reduction -Define heart disease and high blood pressure. Discus how high blood pressure affects the body and ways to reduce high blood pressure.   Exercise and Your Heart -Discuss why it is important to exercise, the FITT principles of exercise, normal and abnormal responses to exercise, and how to exercise safely. Flowsheet Row CARDIAC REHAB PHASE II EXERCISE from 02/03/2021 in [place] CARDIAC REHABILITATION  Date 02/03/21  Educator mk  Instruction Review Code 2- Demonstrated Understanding      Angina -Discuss definition of angina, causes of angina, treatment of angina, and how to decrease risk of having angina.   Cardiac Medications -Review what the following cardiac medications are used for, how they affect the body, and side effects that may occur when taking the medications.  Medications include Aspirin, Beta blockers, calcium channel blockers, ACE Inhibitors, angiotensin receptor blockers, diuretics, digoxin, and antihyperlipidemics.   Congestive Heart Failure -Discuss the definition of CHF, how to live with CHF, the signs and symptoms of CHF, and how keep track of weight and sodium intake.   Heart Disease and Intimacy -Discus the effect sexual activity has on the heart, how changes occur during intimacy as we age, and safety during sexual activity.   Smoking Cessation / COPD -Discuss different methods to quit smoking, the health benefits of quitting smoking, and the definition of COPD.   Nutrition I: Fats -Discuss the types of cholesterol, what cholesterol does to the heart, and how cholesterol levels can be controlled.   Nutrition II:  Labels -Discuss the different components of food labels and how to read food label   Heart Parts/Heart Disease and PAD -Discuss the anatomy of the heart, the pathway of blood circulation through the heart, and these are affected by heart disease.   Stress I: Signs and Symptoms -Discuss the causes of stress, how stress may lead to anxiety and depression, and ways to limit stress.   Stress II: Relaxation -Discuss different types of relaxation techniques to limit stress.   Warning Signs of Stroke / TIA -Discuss definition of a stroke, what the signs and symptoms are of a stroke, and how to identify when someone is having stroke.   Knowledge Questionnaire Score:  Knowledge Questionnaire Score - 01/29/21 1256      Knowledge Questionnaire Score   Pre Score 22/24           Core Components/Risk Factors/Patient Goals at Admission:  Personal Goals and Risk Factors at Admission - 01/29/21 1301      Core Components/Risk Factors/Patient Goals on Admission    Weight Management Yes;Obesity;Weight Loss    Intervention Weight  Management: Develop a combined nutrition and exercise program designed to reach desired caloric intake, while maintaining appropriate intake of nutrient and fiber, sodium and fats, and appropriate energy expenditure required for the weight goal.;Weight Management: Provide education and appropriate resources to help participant work on and attain dietary goals.;Weight Management/Obesity: Establish reasonable short term and long term weight goals.;Obesity: Provide education and appropriate resources to help participant work on and attain dietary goals.    Admit Weight 227 lb (103 kg)    Goal Weight: Short Term 210 lb (95.3 kg)    Expected Outcomes Short Term: Continue to assess and modify interventions until short term weight is achieved;Long Term: Adherence to nutrition and physical activity/exercise program aimed toward attainment of established weight goal;Weight  Maintenance: Understanding of the daily nutrition guidelines, which includes 25-35% calories from fat, 7% or less cal from saturated fats, less than 200mg  cholesterol, less than 1.5gm of sodium, & 5 or more servings of fruits and vegetables daily;Weight Loss: Understanding of general recommendations for a balanced deficit meal plan, which promotes 1-2 lb weight loss per week and includes a negative energy balance of [phone] kcal/d;Understanding recommendations for meals to include 15-35% energy as protein, 25-35% energy from fat, 35-60% energy from carbohydrates, less than 200mg  of dietary cholesterol, 20-35 gm of total fiber daily;Understanding of distribution of calorie intake throughout the day with the consumption of 4-5 meals/snacks    Improve shortness of breath with ADL's Yes    Intervention Provide education, individualized exercise plan and daily activity instruction to help decrease symptoms of SOB with activities of daily living.    Expected Outcomes Short Term: Improve cardiorespiratory fitness to achieve a reduction of symptoms when performing ADLs;Long Term: Be able to perform more ADLs without symptoms or delay the onset of symptoms           Core Components/Risk Factors/Patient Goals Review:   Goals and Risk Factor Review    Row Name 02/16/21 1506 03/12/21 0958           Core Components/Risk Factors/Patient Goals Review   Personal Goals Review Weight Management/Obesity;Improve shortness of breath with ADL's Weight Management/Obesity;Improve shortness of breath with ADL's      Review Patient is new to the program completing 3 sessions. He was referred to CR with STEMI and Stent placement. He has multiple risk factors for CAD and is participating in the program for risk modification. His cardiologist was notified on 02/04/21 due to frequent PVC's with bigeminy at times. MD put CR on hold, increased his metoprolol to 25 mg BID and ordered a 72 hour holter monitor with no results  documented. Patient's personal goals for the program are to get stronger; breathe better; be able to do his ADL's and lose weight. We will continue to monitor his progress as he works toward meeting these goals. Patient has not attended the program during this 30 day review period. He is currently on hold awaiting results of 14 day heart monitor. We will continue to monitor.      Expected Outcomes Patient will complete the program meeting both personal and program goals. Patient will complete the program meeting both personal and program goals.             Core Components/Risk Factors/Patient Goals at Discharge (Final Review):   Goals and Risk Factor Review - 03/12/21 0958      Core Components/Risk Factors/Patient Goals Review   Personal Goals Review Weight Management/Obesity;Improve shortness of breath with ADL's    Review Patient  has not attended the program during this 30 day review period. He is currently on hold awaiting results of 14 day heart monitor. We will continue to monitor.    Expected Outcomes Patient will complete the program meeting both personal and program goals.           ITP Comments:   Comments: ITP REVIEW Patient on hold until he is released to return to the program.

## 2021-03-25 NOTE — Addendum Note (Signed)
Encounter addended by: Suann Larry, RN on: 03/25/2021 9:41 AM  Actions taken: Clinical Note Signed

## 2021-03-26 ENCOUNTER — Encounter (HOSPITAL_COMMUNITY): Payer: BC Managed Care – PPO

## 2021-03-29 ENCOUNTER — Encounter (HOSPITAL_COMMUNITY): Payer: BC Managed Care – PPO

## 2021-03-31 ENCOUNTER — Encounter (HOSPITAL_COMMUNITY): Payer: BC Managed Care – PPO

## 2021-04-02 ENCOUNTER — Encounter (HOSPITAL_COMMUNITY): Payer: BC Managed Care – PPO

## 2021-04-05 ENCOUNTER — Encounter (HOSPITAL_COMMUNITY): Payer: BC Managed Care – PPO

## 2021-04-05 NOTE — Addendum Note (Signed)
Encounter addended by: Suann Larry, RN on: 04/05/2021 12:56 PM  Actions taken: Flowsheet data copied forward, Flowsheet accepted

## 2021-04-07 ENCOUNTER — Encounter (HOSPITAL_COMMUNITY): Payer: BC Managed Care – PPO

## 2021-04-09 ENCOUNTER — Encounter (HOSPITAL_COMMUNITY): Payer: BC Managed Care – PPO

## 2021-04-12 ENCOUNTER — Encounter (HOSPITAL_COMMUNITY): Payer: BC Managed Care – PPO

## 2021-04-14 ENCOUNTER — Encounter (HOSPITAL_COMMUNITY): Payer: BC Managed Care – PPO

## 2021-04-16 ENCOUNTER — Encounter (HOSPITAL_COMMUNITY): Payer: BC Managed Care – PPO

## 2021-04-19 ENCOUNTER — Encounter (HOSPITAL_COMMUNITY): Payer: BC Managed Care – PPO

## 2021-04-21 ENCOUNTER — Encounter (HOSPITAL_COMMUNITY): Payer: BC Managed Care – PPO

## 2021-04-23 ENCOUNTER — Encounter (HOSPITAL_COMMUNITY): Payer: BC Managed Care – PPO

## 2021-04-23 NOTE — Progress Notes (Signed)
Discharge Progress Report  Patient Details  Name: Ronald Valdez MRN: [id number] Date of Birth: 1970/12/07 Referring Provider:   Flowsheet Row CARDIAC REHAB PHASE II ORIENTATION from 01/29/2021 in [facility] CARDIAC [facility]ILITATION  Referring Provider Dr. Darrold Junker        Number of Visits: 3  Reason for Discharge:  Early Exit:  Lack of attendance  Smoking History:  Social History   Tobacco Use  Smoking Status Former   Packs/day: 1.50   Years: 38.00   Pack years: 57.00   Types: Cigarettes   Quit date: 12/24/2020   Years since quitting: 0.3  Smokeless Tobacco Never    Diagnosis:  Status post coronary artery stent placement  ADL UCSD:   Initial Exercise Prescription:  Initial Exercise Prescription - 01/29/21 1400       Date of Initial Exercise RX and Referring Provider   Date 01/29/21    Referring Provider Dr. Darrold Junker    Expected Discharge Date 04/23/21      Treadmill   MPH 1    Grade 0    Minutes 17      NuStep   Level 1    SPM 80    Minutes 22      Prescription Details   Frequency (times per week) 3    Duration Progress to 30 minutes of continuous aerobic without signs/symptoms of physical distress      Intensity   THRR 40-80% of Max Heartrate 68-136    Ratings of Perceived Exertion 11-13    Perceived Dyspnea 0-4      Resistance Training   Training Prescription Yes    Weight 3 lbs    Reps 10-15             Discharge Exercise Prescription (Final Exercise Prescription Changes):  Exercise Prescription Changes - 02/08/21 1300       Response to Exercise   Blood Pressure (Admit) 114/82    Blood Pressure (Exercise) 130/70    Blood Pressure (Exit) 114/72    Heart Rate (Admit) 64 bpm    Heart Rate (Exercise) 90 bpm    Heart Rate (Exit) 72 bpm    Rating of Perceived Exertion (Exercise) 13    Duration Continue with 30 min of aerobic exercise without signs/symptoms of physical distress.    Intensity THRR unchanged      Progression    Progression Continue to progress workloads to maintain intensity without signs/symptoms of physical distress.      Resistance Training   Training Prescription Yes    Weight 3 lbs    Reps 10-15    Time 10 Minutes      Treadmill   MPH 1.5    Grade 0    Minutes 17    METs 2.15      NuStep   Level 1    SPM 70    Minutes 22    METs 1.7             Functional Capacity:  6 Minute Walk     Row Name 01/29/21 1410         6 Minute Walk   Phase Initial     Distance 1200 feet     Walk Time 6 minutes     # of Rest Breaks 1     MPH 2.27     METS 3.2     RPE 12     Perceived Dyspnea  15     VO2 Peak 11.21  Symptoms Yes (comment)     Comments standing rest due to SOB for 20 seconds half way into the test     Resting HR 65 bpm     Resting BP 124/80     Resting Oxygen Saturation  97 %     Exercise Oxygen Saturation  during 6 min walk 97 %     Max Ex. HR 76 bpm     Max Ex. BP 144/72     2 Minute Post BP 120/70              Psychological, QOL, Others - Outcomes: PHQ 2/9: Depression screen PHQ 2/9 01/29/2021  Decreased Interest 0  Down, Depressed, Hopeless 0  PHQ - 2 Score 0  Altered sleeping 3  Tired, decreased energy 3  Change in appetite 0  Feeling bad or failure about yourself  0  Trouble concentrating 0  Moving slowly or fidgety/restless 0  Suicidal thoughts 0  PHQ-9 Score 6  Difficult doing work/chores Very difficult    Quality of Life:  Quality of Life - 01/29/21 1402       Quality of Life   Select Quality of Life      Quality of Life Scores   Health/Function Pre 15.2 %    Socioeconomic Pre 26.57 %    Psych/Spiritual Pre 15.43 %    Family Pre 28.8 %    GLOBAL Pre 19.59 %             Personal Goals: Goals established at orientation with interventions provided to work toward goal.  Personal Goals and Risk Factors at Admission - 01/29/21 1301       Core Components/Risk Factors/Patient Goals on Admission    Weight Management  Yes;Obesity;Weight Loss    Intervention Weight Management: Develop a combined nutrition and exercise program designed to reach desired caloric intake, while maintaining appropriate intake of nutrient and fiber, sodium and fats, and appropriate energy expenditure required for the weight goal.;Weight Management: Provide education and appropriate resources to help participant work on and attain dietary goals.;Weight Management/Obesity: Establish reasonable short term and long term weight goals.;Obesity: Provide education and appropriate resources to help participant work on and attain dietary goals.    Admit Weight 227 lb (103 kg)    Goal Weight: Short Term 210 lb (95.3 kg)    Expected Outcomes Short Term: Continue to assess and modify interventions until short term weight is achieved;Long Term: Adherence to nutrition and physical activity/exercise program aimed toward attainment of established weight goal;Weight Maintenance: Understanding of the daily nutrition guidelines, which includes 25-35% calories from fat, 7% or less cal from saturated fats, less than 200mg  cholesterol, less than 1.5gm of sodium, & 5 or more servings of fruits and vegetables daily;Weight Loss: Understanding of general recommendations for a balanced deficit meal plan, which promotes 1-2 lb weight loss per week and includes a negative energy balance of [phone] kcal/d;Understanding recommendations for meals to include 15-35% energy as protein, 25-35% energy from fat, 35-60% energy from carbohydrates, less than 200mg  of dietary cholesterol, 20-35 gm of total fiber daily;Understanding of distribution of calorie intake throughout the day with the consumption of 4-5 meals/snacks    Improve shortness of breath with ADL's Yes    Intervention Provide education, individualized exercise plan and daily activity instruction to help decrease symptoms of SOB with activities of daily living.    Expected Outcomes Short Term: Improve cardiorespiratory  fitness to achieve a reduction of symptoms when performing ADLs;Long Term: Be able to  perform more ADLs without symptoms or delay the onset of symptoms              Personal Goals Discharge:  Goals and Risk Factor Review     Row Name 02/16/21 1506 03/12/21 0958 04/05/21 1249         Core Components/Risk Factors/Patient Goals Review   Personal Goals Review Weight Management/Obesity;Improve shortness of breath with ADL's Weight Management/Obesity;Improve shortness of breath with ADL's Weight Management/Obesity;Improve shortness of breath with ADL's     Review Patient is new to the program completing 3 sessions. He was referred to CR with STEMI and Stent placement. He has multiple risk factors for CAD and is participating in the program for risk modification. His cardiologist was notified on 02/04/21 due to frequent PVC's with bigeminy at times. MD put CR on hold, increased his metoprolol to 25 mg BID and ordered a 72 hour holter monitor with no results documented. Patient's personal goals for the program are to get stronger; breathe better; be able to do his ADL's and lose weight. We will continue to monitor his progress as he works toward meeting these goals. Patient has not attended the program during this 30 day review period. He is currently on hold awaiting results of 14 day heart monitor. We will continue to monitor. Patient has not attended CR during this 30 day review period. Not clear if he plans to return to the program. We will continue to monitor.     Expected Outcomes Patient will complete the program meeting both personal and program goals. Patient will complete the program meeting both personal and program goals. Patient will complete the program meeting both personal and program goals.              Exercise Goals and Review:  Exercise Goals     Row Name 01/29/21 1420 02/22/21 1358 03/23/21 1030         Exercise Goals   Increase Physical Activity Yes Yes Yes      Intervention Provide advice, education, support and counseling about physical activity/exercise needs.;Develop an individualized exercise prescription for aerobic and resistive training based on initial evaluation findings, risk stratification, comorbidities and participant's personal goals. Provide advice, education, support and counseling about physical activity/exercise needs.;Develop an individualized exercise prescription for aerobic and resistive training based on initial evaluation findings, risk stratification, comorbidities and participant's personal goals. Provide advice, education, support and counseling about physical activity/exercise needs.;Develop an individualized exercise prescription for aerobic and resistive training based on initial evaluation findings, risk stratification, comorbidities and participant's personal goals.     Expected Outcomes Short Term: Attend rehab on a regular basis to increase amount of physical activity.;Long Term: Add in home exercise to make exercise part of routine and to increase amount of physical activity.;Long Term: Exercising regularly at least 3-5 days a week. Short Term: Attend rehab on a regular basis to increase amount of physical activity.;Long Term: Add in home exercise to make exercise part of routine and to increase amount of physical activity.;Long Term: Exercising regularly at least 3-5 days a week. Short Term: Attend rehab on a regular basis to increase amount of physical activity.;Long Term: Add in home exercise to make exercise part of routine and to increase amount of physical activity.;Long Term: Exercising regularly at least 3-5 days a week.     Increase Strength and Stamina Yes Yes Yes     Intervention Provide advice, education, support and counseling about physical activity/exercise needs.;Develop an individualized exercise prescription for aerobic  and resistive training based on initial evaluation findings, risk stratification, comorbidities and  participant's personal goals. Provide advice, education, support and counseling about physical activity/exercise needs.;Develop an individualized exercise prescription for aerobic and resistive training based on initial evaluation findings, risk stratification, comorbidities and participant's personal goals. Provide advice, education, support and counseling about physical activity/exercise needs.;Develop an individualized exercise prescription for aerobic and resistive training based on initial evaluation findings, risk stratification, comorbidities and participant's personal goals.     Expected Outcomes Short Term: Increase workloads from initial exercise prescription for resistance, speed, and METs.;Short Term: Perform resistance training exercises routinely during rehab and add in resistance training at home;Long Term: Improve cardiorespiratory fitness, muscular endurance and strength as measured by increased METs and functional capacity ( ) Short Term: Increase workloads from initial exercise prescription for resistance, speed, and METs.;Short Term: Perform resistance training exercises routinely during rehab and add in resistance training at home;Long Term: Improve cardiorespiratory fitness, muscular endurance and strength as measured by increased METs and functional capacity ( ) Short Term: Increase workloads from initial exercise prescription for resistance, speed, and METs.;Short Term: Perform resistance training exercises routinely during rehab and add in resistance training at home;Long Term: Improve cardiorespiratory fitness, muscular endurance and strength as measured by increased METs and functional capacity ( )     Able to understand and use rate of perceived exertion (RPE) scale Yes Yes Yes     Intervention Provide education and explanation on how to use RPE scale Provide education and explanation on how to use RPE scale Provide education and explanation on how to use RPE scale      Expected Outcomes Short Term: Able to use RPE daily in rehab to express subjective intensity level;Long Term:  Able to use RPE to guide intensity level when exercising independently Short Term: Able to use RPE daily in rehab to express subjective intensity level;Long Term:  Able to use RPE to guide intensity level when exercising independently Short Term: Able to use RPE daily in rehab to express subjective intensity level;Long Term:  Able to use RPE to guide intensity level when exercising independently     Knowledge and understanding of Target Heart Rate Range (THRR) Yes Yes Yes     Intervention Provide education and explanation of THRR including how the numbers were predicted and where they are located for reference Provide education and explanation of THRR including how the numbers were predicted and where they are located for reference Provide education and explanation of THRR including how the numbers were predicted and where they are located for reference     Expected Outcomes Short Term: Able to state/look up THRR;Long Term: Able to use THRR to govern intensity when exercising independently;Short Term: Able to use daily as guideline for intensity in rehab Short Term: Able to state/look up THRR;Long Term: Able to use THRR to govern intensity when exercising independently;Short Term: Able to use daily as guideline for intensity in rehab Short Term: Able to state/look up THRR;Long Term: Able to use THRR to govern intensity when exercising independently;Short Term: Able to use daily as guideline for intensity in rehab     Able to check pulse independently Yes Yes Yes     Intervention Provide education and demonstration on how to check pulse in carotid and radial arteries.;Review the importance of being able to check your own pulse for safety during independent exercise Provide education and demonstration on how to check pulse in carotid and radial arteries.;Review the importance of being able to check your  own pulse for safety during independent exercise Provide education and demonstration on how to check pulse in carotid and radial arteries.;Review the importance of being able to check your own pulse for safety during independent exercise     Expected Outcomes Short Term: Able to explain why pulse checking is important during independent exercise;Long Term: Able to check pulse independently and accurately Short Term: Able to explain why pulse checking is important during independent exercise;Long Term: Able to check pulse independently and accurately Short Term: Able to explain why pulse checking is important during independent exercise;Long Term: Able to check pulse independently and accurately     Understanding of Exercise Prescription Yes Yes Yes     Intervention Provide education, explanation, and written materials on patient's individual exercise prescription Provide education, explanation, and written materials on patient's individual exercise prescription Provide education, explanation, and written materials on patient's individual exercise prescription     Expected Outcomes Short Term: Able to explain program exercise prescription;Long Term: Able to explain home exercise prescription to exercise independently Short Term: Able to explain program exercise prescription;Long Term: Able to explain home exercise prescription to exercise independently Short Term: Able to explain program exercise prescription;Long Term: Able to explain home exercise prescription to exercise independently              Exercise Goals Re-Evaluation:  Exercise Goals Re-Evaluation     Row Name 02/22/21 1358 03/23/21 1030           Exercise Goal Re-Evaluation   Exercise Goals Review Increase Physical Activity;Increase Strength and Stamina;Able to understand and use rate of perceived exertion (RPE) scale;Knowledge and understanding of Target Heart Rate Range (THRR);Able to check pulse independently;Understanding of  Exercise Prescription Increase Physical Activity;Increase Strength and Stamina;Able to understand and use rate of perceived exertion (RPE) scale;Knowledge and understanding of Target Heart Rate Range (THRR);Able to check pulse independently;Understanding of Exercise Prescription      Comments Pt has attended 3 cardiac rehab sessions. He is currently on hold. He was found to be in ventricular bigeminy during both of his exercise sessions. These strips were faxed to his cardiologist who put him on hold and placed him on a 3-day continuous EKG monitor. He is still waiting the results of this. He is eager to come back to the program and had an optimistic outlook about completing the program. Will continue to monitor and bring back to rehab once he is given clearance by his cardiologist. Patient has not returned to cardiac rehab yet. Still wating on his EKG monitor results.      Expected Outcomes Through exercise at rehab and at home, the patient will meet their stated goals. Through exercise at rehab and at home, the patient will meet their stated goals.               Nutrition & Weight - Outcomes:  Pre Biometrics - 01/29/21 1421       Pre Biometrics   Height 5\' 8"  (1.727 m)    Weight 232 lb 12.9 oz (105.6 kg)    Waist Circumference 46.5 inches    Hip Circumference 42.5 inches    Waist to Hip Ratio 1.09 %    BMI (Calculated) 35.41    Triceps Skinfold 10 mm    % Body Fat 31.3 %    Grip Strength 22.2 kg    Flexibility 10 in    Single Leg Stand 17.97 seconds              Nutrition:  Nutrition Therapy & Goals - 02/16/21 1505       Personal Nutrition Goals   Comments Patient's scored 66 on his diet assessment. We provide 2 educational sessions with handouts on heart healthy nutrition and offer RD referral if patient is interested.      Intervention Plan   Intervention Nutrition handout(s) given to patient.             Nutrition Discharge:  Nutrition Assessments - 02/16/21  1504       MEDFICTS Scores   Pre Score 66             Education Questionnaire Score:  Knowledge Questionnaire Score - 01/29/21 1256       Knowledge Questionnaire Score   Pre Score 22/24             Goals reviewed with patient; copy given to patient. Pt was discharged from cardiac rehab after 3 sessions. On 02/03/21 he was noted to be in ventricular bigeminy for the majority of the exercise session. These strips were sent off to his cardiologist, Dr. Darrold Junker, who put his rehab on hold until he could wear a heart monitor for 72 hours. It took several weeks to get the resutls back, but when they came back his medications were changed and he was cleared to come back. I was in communication with him, and when he stated he was going to come back, he called out stating that he had been exposed to someone with COVID. He never called back or returned to rehab after this.

## 2021-04-23 NOTE — Addendum Note (Signed)
Encounter addended by: Alisia Ferrari on: 04/23/2021 1:30 PM  Actions taken: Flowsheet accepted, Clinical Note Signed, Episode resolved

## 2021-06-24 DIAGNOSIS — I2119 ST elevation (STEMI) myocardial infarction involving other coronary artery of inferior wall: Secondary | ICD-10-CM | POA: Diagnosis not present

## 2021-06-24 DIAGNOSIS — I10 Essential (primary) hypertension: Secondary | ICD-10-CM | POA: Diagnosis not present

## 2021-06-24 DIAGNOSIS — Z955 Presence of coronary angioplasty implant and graft: Secondary | ICD-10-CM | POA: Diagnosis not present

## 2021-06-24 DIAGNOSIS — R400 Somnolence: Secondary | ICD-10-CM | POA: Diagnosis not present

## 2021-07-13 DIAGNOSIS — G4733 Obstructive sleep apnea (adult) (pediatric): Secondary | ICD-10-CM | POA: Diagnosis not present

## 2021-07-13 DIAGNOSIS — R0602 Shortness of breath: Secondary | ICD-10-CM | POA: Diagnosis not present

## 2021-07-14 DIAGNOSIS — G4733 Obstructive sleep apnea (adult) (pediatric): Secondary | ICD-10-CM | POA: Diagnosis not present

## 2021-07-14 DIAGNOSIS — R0602 Shortness of breath: Secondary | ICD-10-CM | POA: Diagnosis not present

## 2021-08-03 DIAGNOSIS — M13861 Other specified arthritis, right knee: Secondary | ICD-10-CM | POA: Diagnosis not present

## 2021-08-03 DIAGNOSIS — M25561 Pain in right knee: Secondary | ICD-10-CM | POA: Diagnosis not present

## 2021-08-14 IMAGING — DX DG CHEST 1V PORT
1 series · 1 of 1 positions shown · non-contrast
Comparison: November 25, 2014

CLINICAL DATA: Chest pain.

EXAM:
PORTABLE CHEST 1 VIEW

[chest ap]
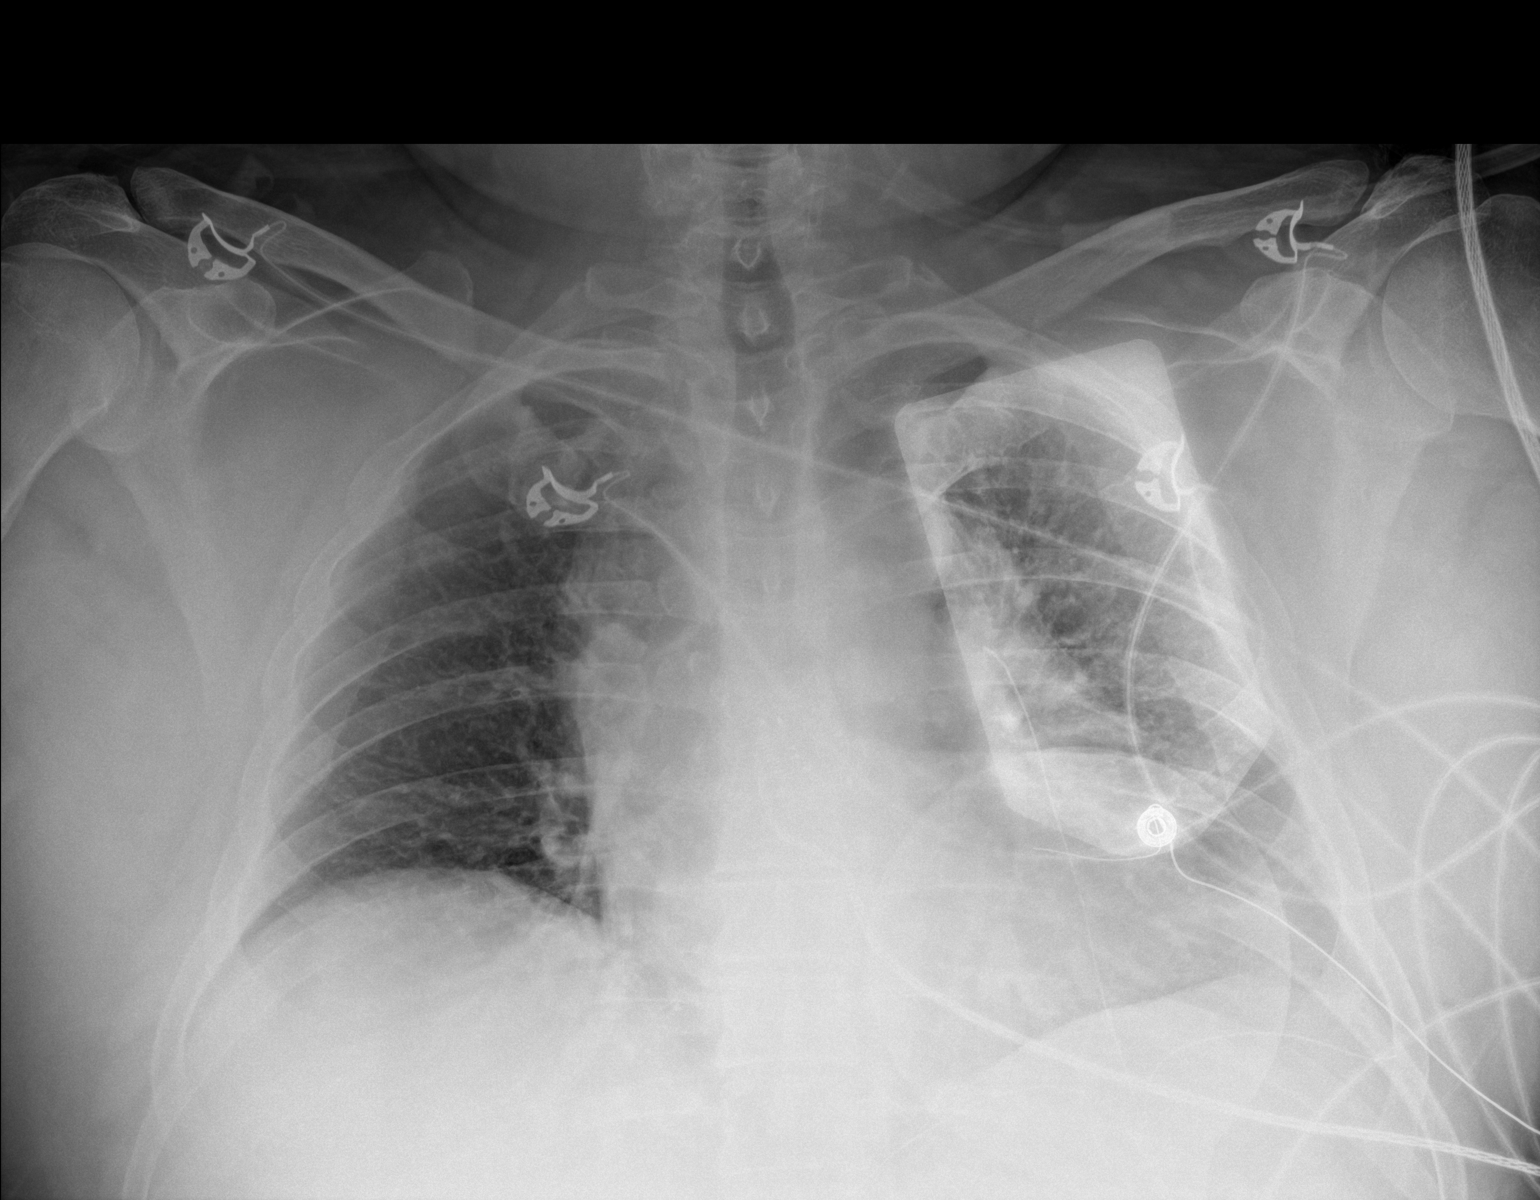

[1 of 1 positions shown; findings below may reference images not displayed]

FINDINGS: Decreased lung volumes are seen which is likely secondary to the
degree of patient inspiration. Multiple radiopaque tags and lead
wires are seen with subsequently limited evaluation of the left
lung. The right lung is clear. There is no evidence of a pleural
effusion or pneumothorax. There is mild to moderate severity
enlargement of the cardiac silhouette. The visualized skeletal
structures are unremarkable.
IMPRESSION: Limited study, as described above, demonstrating no acute or active
cardiopulmonary disease.

## 2021-08-16 DIAGNOSIS — J019 Acute sinusitis, unspecified: Secondary | ICD-10-CM | POA: Diagnosis not present

## 2021-08-16 DIAGNOSIS — Z20822 Contact with and (suspected) exposure to covid-19: Secondary | ICD-10-CM | POA: Diagnosis not present

## 2021-09-03 DIAGNOSIS — R400 Somnolence: Secondary | ICD-10-CM | POA: Diagnosis not present

## 2021-09-03 DIAGNOSIS — R0683 Snoring: Secondary | ICD-10-CM | POA: Diagnosis not present

## 2021-09-03 DIAGNOSIS — G4733 Obstructive sleep apnea (adult) (pediatric): Secondary | ICD-10-CM | POA: Diagnosis not present

## 2021-09-03 DIAGNOSIS — I10 Essential (primary) hypertension: Secondary | ICD-10-CM | POA: Diagnosis not present

## 2021-09-08 DIAGNOSIS — H3552 Pigmentary retinal dystrophy: Secondary | ICD-10-CM | POA: Diagnosis not present

## 2021-09-08 DIAGNOSIS — Z961 Presence of intraocular lens: Secondary | ICD-10-CM | POA: Diagnosis not present

## 2021-10-03 DIAGNOSIS — R400 Somnolence: Secondary | ICD-10-CM | POA: Diagnosis not present

## 2021-10-03 DIAGNOSIS — I10 Essential (primary) hypertension: Secondary | ICD-10-CM | POA: Diagnosis not present

## 2021-10-03 DIAGNOSIS — R0683 Snoring: Secondary | ICD-10-CM | POA: Diagnosis not present

## 2021-10-03 DIAGNOSIS — G4733 Obstructive sleep apnea (adult) (pediatric): Secondary | ICD-10-CM | POA: Diagnosis not present

## 2021-10-05 DIAGNOSIS — J019 Acute sinusitis, unspecified: Secondary | ICD-10-CM | POA: Diagnosis not present

## 2021-10-05 DIAGNOSIS — R0981 Nasal congestion: Secondary | ICD-10-CM | POA: Diagnosis not present

## 2021-10-05 DIAGNOSIS — Z20822 Contact with and (suspected) exposure to covid-19: Secondary | ICD-10-CM | POA: Diagnosis not present

## 2021-10-21 DIAGNOSIS — I10 Essential (primary) hypertension: Secondary | ICD-10-CM | POA: Diagnosis not present

## 2021-10-21 DIAGNOSIS — I2119 ST elevation (STEMI) myocardial infarction involving other coronary artery of inferior wall: Secondary | ICD-10-CM | POA: Diagnosis not present

## 2021-10-21 DIAGNOSIS — Z23 Encounter for immunization: Secondary | ICD-10-CM | POA: Diagnosis not present

## 2021-10-21 DIAGNOSIS — E6609 Other obesity due to excess calories: Secondary | ICD-10-CM | POA: Diagnosis not present

## 2021-11-03 DIAGNOSIS — G4733 Obstructive sleep apnea (adult) (pediatric): Secondary | ICD-10-CM | POA: Diagnosis not present

## 2021-11-03 DIAGNOSIS — R0683 Snoring: Secondary | ICD-10-CM | POA: Diagnosis not present

## 2021-11-03 DIAGNOSIS — I10 Essential (primary) hypertension: Secondary | ICD-10-CM | POA: Diagnosis not present

## 2021-11-03 DIAGNOSIS — R400 Somnolence: Secondary | ICD-10-CM | POA: Diagnosis not present

## 2021-11-17 DIAGNOSIS — Z20822 Contact with and (suspected) exposure to covid-19: Secondary | ICD-10-CM | POA: Diagnosis not present

## 2021-12-04 DIAGNOSIS — I10 Essential (primary) hypertension: Secondary | ICD-10-CM | POA: Diagnosis not present

## 2021-12-04 DIAGNOSIS — R400 Somnolence: Secondary | ICD-10-CM | POA: Diagnosis not present

## 2021-12-04 DIAGNOSIS — R0683 Snoring: Secondary | ICD-10-CM | POA: Diagnosis not present

## 2021-12-04 DIAGNOSIS — G4733 Obstructive sleep apnea (adult) (pediatric): Secondary | ICD-10-CM | POA: Diagnosis not present

## 2021-12-16 ENCOUNTER — Encounter: Payer: Self-pay | Admitting: Family Medicine

## 2021-12-16 DIAGNOSIS — H35033 Hypertensive retinopathy, bilateral: Secondary | ICD-10-CM | POA: Diagnosis not present

## 2021-12-16 DIAGNOSIS — H3552 Pigmentary retinal dystrophy: Secondary | ICD-10-CM | POA: Diagnosis not present

## 2021-12-16 DIAGNOSIS — H35372 Puckering of macula, left eye: Secondary | ICD-10-CM | POA: Diagnosis not present

## 2022-01-01 DIAGNOSIS — R400 Somnolence: Secondary | ICD-10-CM | POA: Diagnosis not present

## 2022-01-01 DIAGNOSIS — I10 Essential (primary) hypertension: Secondary | ICD-10-CM | POA: Diagnosis not present

## 2022-01-01 DIAGNOSIS — R0683 Snoring: Secondary | ICD-10-CM | POA: Diagnosis not present

## 2022-01-01 DIAGNOSIS — G4733 Obstructive sleep apnea (adult) (pediatric): Secondary | ICD-10-CM | POA: Diagnosis not present

## 2022-01-04 ENCOUNTER — Ambulatory Visit: Payer: Self-pay | Admitting: Family Medicine

## 2022-01-10 DIAGNOSIS — I10 Essential (primary) hypertension: Secondary | ICD-10-CM | POA: Diagnosis not present

## 2022-01-10 DIAGNOSIS — E6609 Other obesity due to excess calories: Secondary | ICD-10-CM | POA: Diagnosis not present

## 2022-01-10 DIAGNOSIS — I2119 ST elevation (STEMI) myocardial infarction involving other coronary artery of inferior wall: Secondary | ICD-10-CM | POA: Diagnosis not present

## 2022-01-10 DIAGNOSIS — Z955 Presence of coronary angioplasty implant and graft: Secondary | ICD-10-CM | POA: Diagnosis not present

## 2022-02-01 DIAGNOSIS — R0683 Snoring: Secondary | ICD-10-CM | POA: Diagnosis not present

## 2022-02-01 DIAGNOSIS — R400 Somnolence: Secondary | ICD-10-CM | POA: Diagnosis not present

## 2022-02-01 DIAGNOSIS — G4733 Obstructive sleep apnea (adult) (pediatric): Secondary | ICD-10-CM | POA: Diagnosis not present

## 2022-02-01 DIAGNOSIS — I10 Essential (primary) hypertension: Secondary | ICD-10-CM | POA: Diagnosis not present

## 2022-02-08 ENCOUNTER — Encounter: Payer: Self-pay | Admitting: Family Medicine

## 2022-02-08 ENCOUNTER — Ambulatory Visit (INDEPENDENT_AMBULATORY_CARE_PROVIDER_SITE_OTHER): Payer: BC Managed Care – PPO | Admitting: Family Medicine

## 2022-02-08 VITALS — BP 157/88 | HR 91 | Temp 98.8°F | Ht 66.0 in | Wt 239.6 lb

## 2022-02-08 DIAGNOSIS — Z720 Tobacco use: Secondary | ICD-10-CM

## 2022-02-08 DIAGNOSIS — Z6838 Body mass index (BMI) 38.0-38.9, adult: Secondary | ICD-10-CM | POA: Diagnosis not present

## 2022-02-08 DIAGNOSIS — Z130 Encounter for screening for diseases of the blood and blood-forming organs and certain disorders involving the immune mechanism: Secondary | ICD-10-CM

## 2022-02-08 DIAGNOSIS — I2510 Atherosclerotic heart disease of native coronary artery without angina pectoris: Secondary | ICD-10-CM | POA: Diagnosis not present

## 2022-02-08 DIAGNOSIS — I10 Essential (primary) hypertension: Secondary | ICD-10-CM | POA: Diagnosis not present

## 2022-02-08 DIAGNOSIS — H3552 Pigmentary retinal dystrophy: Secondary | ICD-10-CM | POA: Insufficient documentation

## 2022-02-08 DIAGNOSIS — I252 Old myocardial infarction: Secondary | ICD-10-CM | POA: Insufficient documentation

## 2022-02-08 DIAGNOSIS — J0100 Acute maxillary sinusitis, unspecified: Secondary | ICD-10-CM

## 2022-02-08 DIAGNOSIS — Z125 Encounter for screening for malignant neoplasm of prostate: Secondary | ICD-10-CM | POA: Diagnosis not present

## 2022-02-08 DIAGNOSIS — J329 Chronic sinusitis, unspecified: Secondary | ICD-10-CM | POA: Insufficient documentation

## 2022-02-08 MED ORDER — DOXYCYCLINE HYCLATE 100 MG PO TABS: ORAL_TABLET | ORAL | 0 refills | Status: DC

## 2022-02-08 NOTE — Progress Notes (Signed)
? ?Subjective:  ?Patient ID: Ronald Valdez, male    DOB: 1970/12/16  Age: 51 y.o. MRN: [id number] ? ?CC: ?Chief Complaint  ?Patient presents with  ? Establish Care  ?  Pt has eye disease and pt states it is getting to the point where he is going to have to come out of work. Seen eye doctor in February. Pt having "sinus mess" for over a week.   ? ? ?HPI: ? ?51 year old male with coronary disease, hypertension, obesity, tobacco abuse, retinitis pigmentosa presents to establish care. ? ?Patient has a history of coronary artery disease status post PCI.  Follows with cardiology.  Has been stable.  No current chest pain.  No shortness of breath. ? ?Patient is smoker.  Smokes 5 cigarettes a day.  Advised that he needs to quit. ? ?Patient has hypertension.  He is on metoprolol and losartan.  BP elevated today.  Patient states that his blood pressures are stable at home. ? ?Patient reports a 2-week history of sinus issues.  Reports sinus pressure and congestion.  No fever.  No relieving factors.  Associated symptoms.  No other complaints. ? ?Patient Active Problem List  ? Diagnosis Date Noted  ? CAD (coronary artery disease) 02/08/2022  ? History of ST elevation myocardial infarction (STEMI) 02/08/2022  ? Essential hypertension 02/08/2022  ? Class 2 severe obesity with serious comorbidity and body mass index (BMI) of 38.0 to 38.9 in adult [facility]) 02/08/2022  ? 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 Tobacco abuse 02/08/2022  ? Sinusitis 02/08/2022  ? ? ?Social Hx   ?Social History  ? ?Socioeconomic History  ? Marital status: Married  ?  Spouse name: Not on file  ? Number of children: 3  ? Years of education: Not on file  ? Highest education level: GED or equivalent  ?Occupational History  ? Occupation: maintenance tech  ?  Employer: [employer]  ?  Comment: on short term disability currently  ?Tobacco Use  ? Smoking status: Former  ?  Packs/day: 1.50  ?  Years: 38.00  ?  Pack years: 57.00  ?  Types: Cigarettes   ?  Quit date: 12/24/2020  ?  Years since quitting: 1.1  ? Smokeless tobacco: Never  ?Substance and Sexual Activity  ? Alcohol use: No  ? Drug use: No  ? Sexual activity: Not on file  ?Other Topics Concern  ? Not on file  ?Social History Narrative  ? Not on file  ? ?Social Determinants of Health  ? ?Financial Resource Strain: Not on file  ?Food Insecurity: Not on file  ?Transportation Needs: Not on file  ?Physical Activity: Not on file  ?Stress: Not on file  ?Social Connections: Not on file  ? ? ?Review of Systems  ?Constitutional: Negative.   ?Eyes:  Positive for visual disturbance.  ?Respiratory: Negative.    ?Cardiovascular: Negative.   ? ? ?Objective:  ?BP (!) 157/88   Pulse 91   Temp 98.8 ?F (37.1 ?C)   Ht $R'5\' 6"'Vk$  (1.676 m)   Wt 239 lb 9.6 oz (108.7 kg)   SpO2 98%   BMI 38.67 kg/m?  ? ? ?  02/08/2022  ?  1:34 PM 02/19/2021  ? 11:06 AM 01/29/2021  ?  2:21 PM  ?BP/Weight  ?Systolic BP 353 614   ?Diastolic BP 88 76   ?Wt. (Lbs) 239.6  232.81  ?BMI 38.67 kg/m2  35.4 kg/m2  ? ? ?Physical Exam ?Vitals and nursing note reviewed.  ?Constitutional:   ?  General: He is not in acute distress. ?   Appearance: Normal appearance. He is obese.  ?HENT:  ?   Head: Normocephalic and atraumatic.  ?Eyes:  ?   General:     ?   Right eye: No discharge.     ?   Left eye: No discharge.  ?   Conjunctiva/sclera: Conjunctivae normal.  ?Cardiovascular:  ?   Rate and Rhythm: Normal rate and regular rhythm.  ?Pulmonary:  ?   Effort: Pulmonary effort is normal.  ?   Breath sounds: Normal breath sounds. No wheezing, rhonchi or rales.  ?Abdominal:  ?   General: There is no distension.  ?   Palpations: Abdomen is soft.  ?   Tenderness: There is no abdominal tenderness.  ?Neurological:  ?   Mental Status: He is alert.  ?Psychiatric:     ?   Mood and Affect: Mood normal.     ?   Behavior: Behavior normal.  ? ? ?Lab Results  ?Component Value Date  ? WBC 13.7 (H) 12/27/2020  ? HGB 13.9 12/27/2020  ? HCT 40.5 12/27/2020  ? PLT 185 12/27/2020  ?  GLUCOSE 120 (H) 12/27/2020  ? ALT 31 12/25/2020  ? AST 31 12/25/2020  ? NA 138 12/27/2020  ? K 3.5 12/27/2020  ? CL 107 12/27/2020  ? CREATININE 0.88 12/27/2020  ? BUN 13 12/27/2020  ? CO2 23 12/27/2020  ? INR 1.1 12/25/2020  ? ? ? ?Assessment & Plan:  ? ?Problem List Items Addressed This Visit   ? ?  ? Cardiovascular and Mediastinum  ? CAD (coronary artery disease) - Primary  ?  Status post PCI.  Follows with cardiology.  Stable at this time.  Continue metoprolol and losartan as well as statin. ? ?  ?  ? Relevant Orders  ? Lipid panel  ? Essential hypertension  ?  BP elevated today.  Awaiting labs.  Continue losartan.  May need dose increase.  Patient states that his blood pressures are well controlled at home.  Recommended goal of less than 130/80. ? ?  ?  ? Relevant Orders  ? CMP14+EGFR  ?  ? Respiratory  ? Sinusitis  ?  Treating with doxycycline. ? ?  ?  ? Relevant Medications  ? doxycycline (VIBRA-TABS) 100 MG tablet  ?  ? Other  ? Class 2 severe obesity with serious comorbidity and body mass index (BMI) of 38.0 to 38.9 in adult [facility])  ? Relevant Orders  ? Hemoglobin A1c  ? Retinitis pigmentosa  ? Tobacco abuse  ?  Recommended tobacco cessation. ? ?  ?  ? ?Other Visit Diagnoses   ? ? Screening for deficiency anemia      ? Relevant Orders  ? CBC  ? Prostate cancer screening      ? Relevant Orders  ? PSA  ? ?  ? ? ?Meds ordered this encounter  ?Medications  ? doxycycline (VIBRA-TABS) 100 MG tablet  ?  Sig: Take 1 tablet twice a day for 7 days.  ?  Dispense:  14 tablet  ?  Refill:  0  ? ? ?Follow-up:  Return in about 6 months (around 08/10/2022). ? ?Thersa Salt DO ?[place] ? ?

## 2022-02-08 NOTE — Assessment & Plan Note (Signed)
BP elevated today.  Awaiting labs.  Continue losartan.  May need dose increase.  Patient states that his blood pressures are well controlled at home.  Recommended goal of less than 130/80. ?

## 2022-02-08 NOTE — Assessment & Plan Note (Signed)
Recommended tobacco cessation. 

## 2022-02-08 NOTE — Patient Instructions (Signed)
Labs today. ? ?Consider the preventative healthcare items we discussed. ? ?Try and quit smoking. ? ?Follow up in 6 months. ?

## 2022-02-08 NOTE — Assessment & Plan Note (Signed)
Treating with doxycycline. 

## 2022-02-08 NOTE — Assessment & Plan Note (Signed)
Status post PCI.  Follows with cardiology.  Stable at this time.  Continue metoprolol and losartan as well as statin. ?

## 2022-02-09 ENCOUNTER — Telehealth: Payer: Self-pay | Admitting: Family Medicine

## 2022-02-09 ENCOUNTER — Encounter: Payer: Self-pay | Admitting: Family Medicine

## 2022-02-09 ENCOUNTER — Other Ambulatory Visit: Payer: Self-pay | Admitting: Family Medicine

## 2022-02-09 DIAGNOSIS — Z20822 Contact with and (suspected) exposure to covid-19: Secondary | ICD-10-CM | POA: Diagnosis not present

## 2022-02-09 LAB — CMP14+EGFR
ALT: 46 IU/L — ABNORMAL HIGH (ref 0–44)
AST: 33 IU/L (ref 0–40)
Albumin/Globulin Ratio: 1.7 (ref 1.2–2.2)
Albumin: 4.5 g/dL (ref 3.8–4.9)
Alkaline Phosphatase: 104 IU/L (ref 44–121)
BUN/Creatinine Ratio: 9 (ref 9–20)
BUN: 8 mg/dL (ref 6–24)
Bilirubin Total: <0.2 mg/dL (ref 0.0–1.2)
CO2: 23 mmol/L (ref 20–29)
Calcium: 9.6 mg/dL (ref 8.7–10.2)
Chloride: 103 mmol/L (ref 96–106)
Creatinine, Ser: 0.85 mg/dL (ref 0.76–1.27)
Globulin, Total: 2.6 g/dL (ref 1.5–4.5)
Glucose: 98 mg/dL (ref 70–99)
Potassium: 3.7 mmol/L (ref 3.5–5.2)
Sodium: 141 mmol/L (ref 134–144)
Total Protein: 7.1 g/dL (ref 6.0–8.5)
eGFR: 105 mL/min/{1.73_m2} (ref >59)

## 2022-02-09 LAB — LIPID PANEL
Chol/HDL Ratio: 2.2 ratio (ref 0.0–5.0)
Cholesterol, Total: 70 mg/dL — ABNORMAL LOW (ref 100–199)
HDL: 32 mg/dL — ABNORMAL LOW (ref >39)
LDL Chol Calc (NIH): 16 mg/dL (ref 0–99)
Triglycerides: 120 mg/dL (ref 0–149)
VLDL Cholesterol Cal: 22 mg/dL (ref 5–40)

## 2022-02-09 LAB — CBC
Hematocrit: 42.8 % (ref 37.5–51.0)
Hemoglobin: 14.8 g/dL (ref 13.0–17.7)
MCH: 31.1 pg (ref 26.6–33.0)
MCHC: 34.6 g/dL (ref 31.5–35.7)
MCV: 90 fL (ref 79–97)
Platelets: 200 10*3/uL (ref 150–450)
RBC: 4.76 x10E6/uL (ref 4.14–5.80)
RDW: 12.5 % (ref 11.6–15.4)
WBC: 8.5 10*3/uL (ref 3.4–10.8)

## 2022-02-09 LAB — PSA: Prostate Specific Ag, Serum: 0.6 ng/mL (ref 0.0–4.0)

## 2022-02-09 LAB — HEMOGLOBIN A1C
Est. average glucose Bld gHb Est-mCnc: 128 mg/dL
Hgb A1c MFr Bld: 6.1 % — ABNORMAL HIGH (ref 4.8–5.6)

## 2022-02-09 MED ORDER — NIRMATRELVIR/RITONAVIR (PAXLOVID)TABLET
3.0000 | ORAL_TABLET | Freq: Two times a day (BID) | ORAL | 0 refills | Status: AC
Start: 2022-02-09 — End: 2022-02-14

## 2022-02-09 NOTE — Telephone Encounter (Signed)
FYI- patient was seen yesterday for physical and tested positive this morning for Covid. ?

## 2022-02-09 NOTE — Telephone Encounter (Signed)
Pt contacted. Pt is ok with beginning antiviral treatment. Informed pt to stop antibiotic, pt verbalized understanding.  ?[facility] [place]. ?Please advise. Thank you ?

## 2022-02-28 ENCOUNTER — Encounter: Payer: Self-pay | Admitting: Family Medicine

## 2022-02-28 ENCOUNTER — Ambulatory Visit (INDEPENDENT_AMBULATORY_CARE_PROVIDER_SITE_OTHER): Payer: BC Managed Care – PPO | Admitting: Family Medicine

## 2022-02-28 DIAGNOSIS — H3552 Pigmentary retinal dystrophy: Secondary | ICD-10-CM

## 2022-02-28 NOTE — Patient Instructions (Signed)
Go to the [employer] office and file for disability. ? ?Contact your employer and get any paperwork needed for short term/long term disability. ? ?Take care ? ?Dr. Lacinda Axon  ?

## 2022-02-28 NOTE — Progress Notes (Signed)
? ?Subjective:  ?Patient ID: Ronald Valdez, male    DOB: 1971-06-25  Age: 51 y.o. MRN: [id number] ? ?CC: ?Chief Complaint  ?Patient presents with  ? Discussion  ?  Pt here to discuss coming out of work due to eye disease.   ? ? ?HPI: ? ?51 year old male with hypertension, coronary disease, obesity, tobacco abuse, retinitis pigmentosa presents for evaluation of the above. ? ?Patient is here to discuss disability.  Patient has underlying retinitis pigmentosa and follows closely with Ronald Valdez retina specialist, Dr. Iona Valdez.  His vision loss has progressed and he is unable to do his job safely and effectively due to his vision loss.  He states that he has had time to contemplate this and would like to start disability process.  Additionally, he is currently driving very little and only drives during the day. ? ?Patient Active Problem List  ? Diagnosis Date Noted  ? CAD (coronary artery disease) 02/08/2022  ? History of ST elevation myocardial infarction (STEMI) 02/08/2022  ? Essential hypertension 02/08/2022  ? Class 2 severe obesity with serious comorbidity and body mass index (BMI) of 38.0 to 38.9 in adult [facility]) 02/08/2022  ? 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 Tobacco abuse 02/08/2022  ? ? ?Social Hx   ?Social History  ? ?Socioeconomic History  ? Marital status: Married  ?  Spouse name: Not on file  ? Number of children: 3  ? Years of education: Not on file  ? Highest education level: GED or equivalent  ?Occupational History  ? Occupation: maintenance tech  ?  Employer: [employer]  ?  Comment: on short term disability currently  ?Tobacco Use  ? Smoking status: Former  ?  Packs/day: 1.50  ?  Years: 38.00  ?  Pack years: 57.00  ?  Types: Cigarettes  ?  Quit date: 12/24/2020  ?  Years since quitting: 1.1  ? Smokeless tobacco: Never  ?Substance and Sexual Activity  ? Alcohol use: No  ? Drug use: No  ? Sexual activity: Not on file  ?Other Topics Concern  ? Not on file  ?Social History Narrative  ? Not on  file  ? ?Social Determinants of Health  ? ?Financial Resource Strain: Not on file  ?Food Insecurity: Not on file  ?Transportation Needs: Not on file  ?Physical Activity: Not on file  ?Stress: Not on file  ?Social Connections: Not on file  ? ? ?Review of Systems  ?Constitutional: Negative.   ?Eyes:  Positive for visual disturbance.  ? ? ?Objective:  ?BP (!) 150/84   Pulse 70   Temp 98.3 ?F (36.8 ?C)   Wt 243 lb 3.2 oz (110.3 kg)   SpO2 97%   BMI 39.25 kg/m?  ? ? ?  02/28/2022  ?  1:07 PM 02/08/2022  ?  1:34 PM 02/19/2021  ? 11:06 AM  ?BP/Weight  ?Systolic BP [id number]  ?Diastolic BP 84 88 76  ?Wt. (Lbs) 243.2 239.6   ?BMI 39.25 kg/m2 38.67 kg/m2   ? ? ?Physical Exam ?Vitals and nursing note reviewed.  ?Constitutional:   ?   General: He is not in acute distress. ?   Appearance: Normal appearance. He is obese. He is not ill-appearing.  ?Eyes:  ?   General:     ?   Right eye: No discharge.     ?   Left eye: No discharge.  ?   Conjunctiva/sclera: Conjunctivae normal.  ?Cardiovascular:  ?   Rate and Rhythm: Normal rate and  regular rhythm.  ?Pulmonary:  ?   Effort: Pulmonary effort is normal.  ?   Breath sounds: Normal breath sounds.  ?Neurological:  ?   Mental Status: He is alert.  ?Psychiatric:     ?   Mood and Affect: Mood normal.     ?   Behavior: Behavior normal.  ? ? ?Lab Results  ?Component Value Date  ? WBC 8.5 02/08/2022  ? HGB 14.8 02/08/2022  ? HCT 42.8 02/08/2022  ? PLT 200 02/08/2022  ? GLUCOSE 98 02/08/2022  ? CHOL 70 (L) 02/08/2022  ? TRIG 120 02/08/2022  ? HDL 32 (L) 02/08/2022  ? [place] 16 02/08/2022  ? ALT 46 (H) 02/08/2022  ? AST 33 02/08/2022  ? NA 141 02/08/2022  ? K 3.7 02/08/2022  ? CL 103 02/08/2022  ? CREATININE 0.85 02/08/2022  ? BUN 8 02/08/2022  ? CO2 23 02/08/2022  ? INR 1.1 12/25/2020  ? HGBA1C 6.1 (H) 02/08/2022  ? ? ? ?Assessment & Plan:  ? ?Problem List Items Addressed This Visit   ? ?  ? Other  ? Retinitis pigmentosa  ?  Patient's vision loss is progressing.  I am in support of his  disability claim as I do not feel that he can safely and effectively do his job due to vision loss.  His vision loss will only progress.  Advised patient to reach out to his employer regarding short-term and long-term disability as well as to start the disability process with the social security office. ? ?  ?  ? ?20 mins were spent face to face with the patient discussing his underlying medical issues and the disability process. I also spent time reviewing his most recent visit with his retina specialist. ? ?Thersa Salt DO ?[place] ?  ?

## 2022-03-01 NOTE — Assessment & Plan Note (Signed)
Patient's vision loss is progressing.  I am in support of his disability claim as I do not feel that he can safely and effectively do his job due to vision loss.  His vision loss will only progress.  Advised patient to reach out to his employer regarding short-term and long-term disability as well as to start the disability process with the social security office. ?

## 2022-03-03 ENCOUNTER — Telehealth: Payer: Self-pay | Admitting: Family Medicine

## 2022-03-03 DIAGNOSIS — I10 Essential (primary) hypertension: Secondary | ICD-10-CM | POA: Diagnosis not present

## 2022-03-03 DIAGNOSIS — R400 Somnolence: Secondary | ICD-10-CM | POA: Diagnosis not present

## 2022-03-03 DIAGNOSIS — G4733 Obstructive sleep apnea (adult) (pediatric): Secondary | ICD-10-CM | POA: Diagnosis not present

## 2022-03-03 DIAGNOSIS — R0683 Snoring: Secondary | ICD-10-CM | POA: Diagnosis not present

## 2022-03-03 NOTE — Telephone Encounter (Signed)
Patient had short term disability faxed over to be completed in your blue folder ?

## 2022-04-03 DIAGNOSIS — R0683 Snoring: Secondary | ICD-10-CM | POA: Diagnosis not present

## 2022-04-03 DIAGNOSIS — G4733 Obstructive sleep apnea (adult) (pediatric): Secondary | ICD-10-CM | POA: Diagnosis not present

## 2022-04-03 DIAGNOSIS — R400 Somnolence: Secondary | ICD-10-CM | POA: Diagnosis not present

## 2022-04-03 DIAGNOSIS — I10 Essential (primary) hypertension: Secondary | ICD-10-CM | POA: Diagnosis not present

## 2022-04-08 ENCOUNTER — Telehealth: Payer: Self-pay | Admitting: Family Medicine

## 2022-04-08 NOTE — Telephone Encounter (Signed)
Patient brought in disability form to be completed in your box. He was last seen 02/28/2022

## 2022-05-03 DIAGNOSIS — R0683 Snoring: Secondary | ICD-10-CM | POA: Diagnosis not present

## 2022-05-03 DIAGNOSIS — R400 Somnolence: Secondary | ICD-10-CM | POA: Diagnosis not present

## 2022-05-03 DIAGNOSIS — G4733 Obstructive sleep apnea (adult) (pediatric): Secondary | ICD-10-CM | POA: Diagnosis not present

## 2022-05-03 DIAGNOSIS — I10 Essential (primary) hypertension: Secondary | ICD-10-CM | POA: Diagnosis not present

## 2022-05-30 DIAGNOSIS — I2119 ST elevation (STEMI) myocardial infarction involving other coronary artery of inferior wall: Secondary | ICD-10-CM | POA: Diagnosis not present

## 2022-05-30 DIAGNOSIS — I10 Essential (primary) hypertension: Secondary | ICD-10-CM | POA: Diagnosis not present

## 2022-05-30 DIAGNOSIS — Z955 Presence of coronary angioplasty implant and graft: Secondary | ICD-10-CM | POA: Diagnosis not present

## 2022-05-31 DIAGNOSIS — M5382 Other specified dorsopathies, cervical region: Secondary | ICD-10-CM | POA: Diagnosis not present

## 2022-05-31 DIAGNOSIS — S161XXA Strain of muscle, fascia and tendon at neck level, initial encounter: Secondary | ICD-10-CM | POA: Diagnosis not present

## 2022-05-31 DIAGNOSIS — M542 Cervicalgia: Secondary | ICD-10-CM | POA: Diagnosis not present

## 2022-06-03 DIAGNOSIS — R0683 Snoring: Secondary | ICD-10-CM | POA: Diagnosis not present

## 2022-06-03 DIAGNOSIS — G4733 Obstructive sleep apnea (adult) (pediatric): Secondary | ICD-10-CM | POA: Diagnosis not present

## 2022-06-03 DIAGNOSIS — R400 Somnolence: Secondary | ICD-10-CM | POA: Diagnosis not present

## 2022-06-03 DIAGNOSIS — I10 Essential (primary) hypertension: Secondary | ICD-10-CM | POA: Diagnosis not present

## 2022-06-16 DIAGNOSIS — Z135 Encounter for screening for eye and ear disorders: Secondary | ICD-10-CM | POA: Diagnosis not present

## 2022-06-16 DIAGNOSIS — M50222 Other cervical disc displacement at C5-C6 level: Secondary | ICD-10-CM | POA: Diagnosis not present

## 2022-06-16 DIAGNOSIS — M4802 Spinal stenosis, cervical region: Secondary | ICD-10-CM | POA: Diagnosis not present

## 2022-06-16 DIAGNOSIS — M47819 Spondylosis without myelopathy or radiculopathy, site unspecified: Secondary | ICD-10-CM | POA: Diagnosis not present

## 2022-06-21 ENCOUNTER — Telehealth: Payer: Self-pay

## 2022-06-21 DIAGNOSIS — Z029 Encounter for administrative examinations, unspecified: Secondary | ICD-10-CM

## 2022-06-21 NOTE — Telephone Encounter (Signed)
Caller name:[name]   On DPR? :No  Call back number:[phone]  Provider they see: Adriana Simas   Reason for call:Pt dropped off disability form to be completed

## 2022-06-24 DIAGNOSIS — M47812 Spondylosis without myelopathy or radiculopathy, cervical region: Secondary | ICD-10-CM | POA: Diagnosis not present

## 2022-06-24 DIAGNOSIS — M542 Cervicalgia: Secondary | ICD-10-CM | POA: Diagnosis not present

## 2022-08-10 ENCOUNTER — Ambulatory Visit: Payer: Self-pay | Admitting: Family Medicine

## 2022-08-11 ENCOUNTER — Encounter: Payer: Self-pay | Admitting: Family Medicine

## 2022-08-11 ENCOUNTER — Ambulatory Visit (INDEPENDENT_AMBULATORY_CARE_PROVIDER_SITE_OTHER): Payer: BC Managed Care – PPO | Admitting: Family Medicine

## 2022-08-11 DIAGNOSIS — J988 Other specified respiratory disorders: Secondary | ICD-10-CM

## 2022-08-11 HISTORY — DX: Other specified respiratory disorders: J98.8

## 2022-08-11 MED ORDER — DOXYCYCLINE HYCLATE 100 MG PO TABS: 100.0000 mg | ORAL_TABLET | Freq: Two times a day (BID) | ORAL | 0 refills | Status: DC

## 2022-08-11 NOTE — Assessment & Plan Note (Signed)
Upper and lower tract symptoms. Given co morbidities and duration of illness, treating empirically with Doxycycline.

## 2022-08-11 NOTE — Progress Notes (Signed)
Subjective:  Patient ID: Ronald Valdez, male    DOB: 1971/07/14  Age: 51 y.o. MRN: [id number]  CC: Chief Complaint  Patient presents with   Cough    Congestion and wheezing at night off and on for few weeks but worse last several days    HPI:  51 year old male with CAD, HTN, Tobacco abuse presents for evaluation of respiratory symptoms.   Reports that he has had symptoms x 2 weeks. Has had productive cough, sore throat, mild SOB, subjective fever. Also reports sinus pain/pressure. No current fever. No relieving factors. No reported sick contacts. No other complaints at this time.   Patient Active Problem List   Diagnosis Date Noted   Respiratory infection 08/11/2022   CAD (coronary artery disease) 02/08/2022   History of ST elevation myocardial infarction (STEMI) 02/08/2022   Essential hypertension 02/08/2022   Class 2 severe obesity with serious comorbidity and body mass index (BMI) of 38.0 to 38.9 in adult [facility]) 02/08/2022   Retinitis pigmentosa 02/08/2022   Tobacco abuse 02/08/2022    Social Hx   Social History   Socioeconomic History   Marital status: Married    Spouse name: Not on file   Number of children: 3   Years of education: Not on file   Highest education level: GED or equivalent  Occupational History   Occupation: Magazine features editor: [employer]    Comment: on short term disability currently  Tobacco Use   Smoking status: Former    Packs/day: 1.50    Years: 38.00    Total pack years: 57.00    Types: Cigarettes    Quit date: 12/24/2020    Years since quitting: 1.6   Smokeless tobacco: Never  Substance and Sexual Activity   Alcohol use: No   Drug use: No   Sexual activity: Not on file  Other Topics Concern   Not on file  Social History Narrative   Not on file   Social Determinants of Health   Financial Resource Strain: Not on file  Food Insecurity: Not on file  Transportation Needs: Not on file  Physical  Activity: Not on file  Stress: Not on file  Social Connections: Not on file    Review of Systems Per HPI  Objective:  BP 138/86   Temp 98.7 F (37.1 C) (Oral)   Wt 249 lb 3.2 oz (113 kg)   BMI 40.22 kg/m      08/11/2022    3:38 PM 02/28/2022    1:07 PM 02/08/2022    1:34 PM  BP/Weight  Systolic BP 073 710 626  Diastolic BP 86 84 88  Wt. (Lbs) 249.2 243.2 239.6  BMI 40.22 kg/m2 39.25 kg/m2 38.67 kg/m2    Physical Exam Vitals and nursing note reviewed.  Constitutional:      General: He is not in acute distress.    Appearance: Normal appearance. He is obese.  HENT:     Head: Normocephalic and atraumatic.     Mouth/Throat:     Pharynx: Posterior oropharyngeal erythema present. No oropharyngeal exudate.  Eyes:     General:        Right eye: No discharge.        Left eye: No discharge.     Conjunctiva/sclera: Conjunctivae normal.  Cardiovascular:     Rate and Rhythm: Normal rate and regular rhythm.  Pulmonary:     Effort: Pulmonary effort is normal.     Breath sounds: Normal breath  sounds. No wheezing or rales.  Neurological:     Mental Status: He is alert.     Lab Results  Component Value Date   WBC 8.5 02/08/2022   HGB 14.8 02/08/2022   HCT 42.8 02/08/2022   PLT 200 02/08/2022   GLUCOSE 98 02/08/2022   CHOL 70 (L) 02/08/2022   TRIG 120 02/08/2022   HDL 32 (L) 02/08/2022   LDLCALC 16 02/08/2022   ALT 46 (H) 02/08/2022   AST 33 02/08/2022   NA 141 02/08/2022   K 3.7 02/08/2022   CL 103 02/08/2022   CREATININE 0.85 02/08/2022   BUN 8 02/08/2022   CO2 23 02/08/2022   INR 1.1 12/25/2020   HGBA1C 6.1 (H) 02/08/2022     Assessment & Plan:   Problem List Items Addressed This Visit       Respiratory   Respiratory infection    Upper and lower tract symptoms. Given co morbidities and duration of illness, treating empirically with Doxycycline.        Meds ordered this encounter  Medications   doxycycline (VIBRA-TABS) 100 MG tablet    Sig: Take  1 tablet (100 mg total) by mouth 2 (two) times daily.    Dispense:  14 tablet    Refill:  0    Follow-up:  Return if symptoms worsen or fail to improve.  Everlene Other DO [facility]

## 2022-08-11 NOTE — Patient Instructions (Signed)
Medication as prescribed.  Call with concerns or if you fail to improve or worsen.  Take the antibiotic with food.  Take care  Dr. Lacinda Axon

## 2022-09-07 ENCOUNTER — Telehealth: Payer: Self-pay

## 2022-09-07 ENCOUNTER — Telehealth: Payer: BLUE CROSS/BLUE SHIELD | Admitting: Nurse Practitioner

## 2022-09-07 NOTE — Telephone Encounter (Signed)
Televisit tomorrow-Thursday with any of the providers-nurses-please speak with Betzy she spoke with family last evening

## 2022-09-07 NOTE — Telephone Encounter (Addendum)
Covid positive today with home test , fever of 100.0, sinus congestion and sore throat, cough non prod. Symptoms started 2 days ago. Please advise    -- per dr recommendations, spoke with patient wife and scheduled tele appt

## 2022-09-08 ENCOUNTER — Telehealth (INDEPENDENT_AMBULATORY_CARE_PROVIDER_SITE_OTHER): Payer: BC Managed Care – PPO | Admitting: Family Medicine

## 2022-09-08 DIAGNOSIS — U071 COVID-19: Secondary | ICD-10-CM

## 2022-09-08 MED ORDER — NIRMATRELVIR/RITONAVIR (PAXLOVID)TABLET: 3.0000 | ORAL_TABLET | Freq: Two times a day (BID) | ORAL | 0 refills | Status: AC

## 2022-09-08 NOTE — Telephone Encounter (Signed)
I connected with  Ronald Valdez on 09/08/22 by a video enabled telemedicine application and verified that I am speaking with the correct person using two identifiers.   I discussed the limitations of evaluation and management by telemedicine. The patient expressed understanding and agreed to proceed.

## 2022-09-08 NOTE — Progress Notes (Signed)
   Subjective:    Patient ID: Ronald Valdez, male    DOB: Mar 12, 1971, 51 y.o.   MRN: [id number]  HPI I connected with  Ronald Valdez on 09/08/22 by a video enabled telemedicine application and verified that I am speaking with the correct person using two identifiers.   I discussed the limitations of evaluation and management by telemedicine. The patient expressed understanding and agreed to proceed.  Patient location: home  Provider location: in office  I provided 15 minutes of non face - to - face time during this encounter.   Covid positive today with home test , fever of 100.0, sinus congestion and sore throat, cough non prod. Symptoms started 2 days ago,  please advise  Review of Systems     Objective:   Physical Exam  Patient had virtual visit-video Appears to be in no distress Atraumatic Neuro able to relate and oriented No apparent resp distress Color normal       Assessment & Plan:   Covid infection This is a viral process.  Mild cases are treated with supportive measures at home such as Tylenol rest fluids.  In some situations monoclonal antibodies may be appropriate depending on the patient's risk criteria.  The patient was educated regarding progressive illness including respiratory, persistent vomiting, change in mental status.  If any of these occur ER evaluation is recommended. Patient was educated about the following as well Covid-19 respiratory warning: Covid-19 is a virus that causes hypoxia (low oxygen level in blood) in some people. If you develop any changes in your usual breathing pattern: difficulty catching your breath, more short winded with activity or with resting, or anything that concerns you about your breathing, do not hesitate to go to the emergency department immediately for evaluation. Please do not delay to get treatment.   Agrees with plan of care discussed today. Understands warning signs to seek further care: Chest pain, shortness of  breath, mental confusion, profuse vomiting, any significant change in health. Understands to follow-up if symptoms do not improve, or worsen.    Paxlovid prescribed.  Drug interactions were discussed.  Recent GFR was good.  Stop statin, while on the medicine warning signs discussed follow-up if progressive troubles or shortness of breath

## 2022-10-03 ENCOUNTER — Telehealth: Payer: Self-pay

## 2022-10-03 NOTE — Telephone Encounter (Signed)
Please advise. Thank you

## 2022-10-03 NOTE — Telephone Encounter (Signed)
Caller name: Elease Hashimoto  On DPR?: Yes  Call back number: [phone] (mobile)  Provider they see: Tommie Sams, DO  Reason for call:Pt dropped of disability life insurance form to be completed and faxed placed in Dr Adriana Simas folder to be completed.

## 2022-10-06 ENCOUNTER — Encounter: Payer: Self-pay | Admitting: Family Medicine

## 2022-10-10 ENCOUNTER — Telehealth: Payer: Self-pay | Admitting: Family Medicine

## 2022-10-10 NOTE — Telephone Encounter (Signed)
I am filling out forms that regarding disability. The form is asking me if your dominant hand? Right or Left? Also, other than the vision issues do you feel you have any other limitations at work (lifting, pushing, pulling, etc)?   Thank you   Dr. Adriana Simas  Left message to return call.

## 2022-10-10 NOTE — Telephone Encounter (Signed)
Pt returned call. Pt states he is right handed. Pt states that pushing, pulling and lifting lots of weight is not good due to hernia surgery.

## 2023-02-10 ENCOUNTER — Telehealth: Payer: Self-pay | Admitting: Family Medicine

## 2023-02-10 NOTE — Telephone Encounter (Signed)
Patient dropped off a letter for his job for you to review because he states needs letter saying why he is unable to work since 03/01/2023. The letter is in your box for review.,

## 2023-02-23 ENCOUNTER — Ambulatory Visit: Payer: BC Managed Care – PPO | Admitting: Family Medicine

## 2023-02-23 ENCOUNTER — Other Ambulatory Visit: Payer: Self-pay | Admitting: Family Medicine

## 2023-02-23 VITALS — BP 131/78 | HR 75 | Temp 98.3°F | Ht 66.0 in | Wt 243.0 lb

## 2023-02-23 DIAGNOSIS — H3552 Pigmentary retinal dystrophy: Secondary | ICD-10-CM

## 2023-02-23 DIAGNOSIS — J329 Chronic sinusitis, unspecified: Secondary | ICD-10-CM

## 2023-02-23 MED ORDER — DOXYCYCLINE HYCLATE 100 MG PO TABS: 100.0000 mg | ORAL_TABLET | Freq: Two times a day (BID) | ORAL | 0 refills | Status: DC

## 2023-02-23 NOTE — Patient Instructions (Signed)
Medication as prescribed.  When the insurance issue is resolved, let me know and I will order your labs, place referral for colonoscopy, and order CT lung cancer screening.  Take care  Dr. Adriana Simas

## 2023-02-24 ENCOUNTER — Encounter: Payer: Self-pay | Admitting: Family Medicine

## 2023-02-24 DIAGNOSIS — J329 Chronic sinusitis, unspecified: Secondary | ICD-10-CM | POA: Insufficient documentation

## 2023-02-24 NOTE — Assessment & Plan Note (Signed)
Disability paperwork has been filled out.  Letter has been given to the patient.

## 2023-02-24 NOTE — Progress Notes (Signed)
Subjective:  Patient ID: Ronald Valdez, male    DOB: 1970-12-28  Age: 52 y.o. MRN: [id number]  CC: Chief Complaint  Patient presents with   disbility form   Sinusitis    Green mucus 1 week    HPI:  52 year old male with coronary disease, hypertension, obesity, tobacco abuse, and retinitis pigmentosa presents for evaluation the above.  Patient is in need of disability and work forms to be filled out.  I have filled these out for the patient.  He is currently unable to work due to poor vision related to retinitis pigmentosa.  Patient currently in between insurances.  He is in need of colonoscopy as well as lung cancer screening.  He is amenable to doing this once his insurance gets figured out.  Patient also states that he is currently having respiratory symptoms.  He reports significant sinus pressure and congestion.  Associated headache.  Has been going on for the past week.  Discolored discharge and sputum.  No fever.  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/08/2022   Tobacco abuse 02/08/2022    Social Hx   Social History   Socioeconomic History   Marital status: Married    Spouse name: Not on file   Number of children: 3   Years of education: Not on file   Highest education level: GED or equivalent  Occupational History   Occupation: Leisure centre manager: [employer]    Comment: on short term disability currently  Tobacco Use   Smoking status: Former    Packs/day: 1.50    Years: 38.00    Additional pack years: 0.00    Total pack years: 57.00    Types: Cigarettes    Quit date: 12/24/2020    Years since quitting:  2.1   Smokeless tobacco: Never  Substance and Sexual Activity   Alcohol use: No   Drug use: No   Sexual activity: Not on file  Other Topics Concern   Not on file  Social History Narrative   Not on file   Social Determinants of Health   Financial Resource Strain: Not on file  Food Insecurity: Not on file  Transportation Needs: Not on file  Physical Activity: Not on file  Stress: Not on file  Social Connections: Not on file    Review of Systems Per HPI  Objective:  BP 131/78   Pulse 75   Temp 98.3 F (36.8 C)   Ht 5\' 6"  (1.676 m)   Wt 243 lb (110.2 kg)   SpO2 97%   BMI 39.22 kg/m      02/23/2023    2:40 PM 08/11/2022    3:38 PM 02/28/2022    1:07 PM  BP/Weight  Systolic BP 131 138 150  Diastolic BP 78 86 84  Wt. (Lbs) 243 249.2 243.2  BMI 39.22 kg/m2 40.22 kg/m2 39.25 kg/m2    Physical Exam Vitals and nursing note reviewed.  Constitutional:      General: He is not in acute distress.    Appearance: Normal appearance.  HENT:     Head: Normocephalic and atraumatic.     Right Ear: Tympanic membrane normal.  Left Ear: Tympanic membrane normal.  Cardiovascular:     Rate and Rhythm: Normal rate and regular rhythm.  Pulmonary:     Effort: Pulmonary effort is normal.     Breath sounds: Normal breath sounds. No wheezing, rhonchi or rales.  Neurological:     Mental Status: He is alert.  Psychiatric:        Mood and Affect: Mood normal.        Behavior: Behavior normal.     Lab Results  Component Value Date   WBC 8.5 02/08/2022   HGB 14.8 02/08/2022   HCT 42.8 02/08/2022   PLT 200 02/08/2022   GLUCOSE 98 02/08/2022   CHOL 70 (L) 02/08/2022   TRIG 120 02/08/2022   HDL 32 (L) 02/08/2022   LDLCALC 16 02/08/2022   ALT 46 (H) 02/08/2022   AST 33 02/08/2022   NA 141 02/08/2022   K 3.7 02/08/2022   CL 103 02/08/2022   CREATININE 0.85 02/08/2022   BUN 8 02/08/2022   CO2 23 02/08/2022   INR 1.1 12/25/2020   HGBA1C 6.1 (H) 02/08/2022     Assessment  & Plan:   Problem List Items Addressed This Visit       Respiratory   Rhinosinusitis - Primary    Treating with doxycycline.      Relevant Medications   doxycycline (VIBRA-TABS) 100 MG tablet     Other   Retinitis pigmentosa    Disability paperwork has been filled out.  Letter has been given to the patient.       Meds ordered this encounter  Medications   doxycycline (VIBRA-TABS) 100 MG tablet    Sig: Take 1 tablet (100 mg total) by mouth 2 (two) times daily.    Dispense:  14 tablet    Refill:  0    Follow-up: Patient to follow-up once insurance gets figured out  [employer] DO [facility]

## 2023-02-24 NOTE — Assessment & Plan Note (Signed)
Treating with doxycycline. 

## 2023-02-27 DIAGNOSIS — I10 Essential (primary) hypertension: Secondary | ICD-10-CM | POA: Diagnosis not present

## 2023-02-27 DIAGNOSIS — E6609 Other obesity due to excess calories: Secondary | ICD-10-CM | POA: Diagnosis not present

## 2023-02-27 DIAGNOSIS — Z6836 Body mass index (BMI) 36.0-36.9, adult: Secondary | ICD-10-CM | POA: Diagnosis not present

## 2023-02-27 DIAGNOSIS — Z955 Presence of coronary angioplasty implant and graft: Secondary | ICD-10-CM | POA: Diagnosis not present

## 2023-02-27 DIAGNOSIS — I2119 ST elevation (STEMI) myocardial infarction involving other coronary artery of inferior wall: Secondary | ICD-10-CM | POA: Diagnosis not present

## 2023-06-22 ENCOUNTER — Encounter: Payer: Self-pay | Admitting: Nurse Practitioner

## 2023-06-22 ENCOUNTER — Ambulatory Visit (INDEPENDENT_AMBULATORY_CARE_PROVIDER_SITE_OTHER): Payer: 59 | Admitting: Nurse Practitioner

## 2023-06-22 VITALS — BP 147/92 | HR 74 | Temp 98.2°F | Wt 239.4 lb

## 2023-06-22 DIAGNOSIS — B9689 Other specified bacterial agents as the cause of diseases classified elsewhere: Secondary | ICD-10-CM

## 2023-06-22 DIAGNOSIS — J019 Acute sinusitis, unspecified: Secondary | ICD-10-CM | POA: Diagnosis not present

## 2023-06-22 MED ORDER — DOXYCYCLINE HYCLATE 100 MG PO TABS: 100.0000 mg | ORAL_TABLET | Freq: Two times a day (BID) | ORAL | 0 refills | Status: DC

## 2023-06-22 NOTE — Progress Notes (Signed)
   Subjective:    Patient ID: Ronald Valdez, male    DOB: [birth year], 52 y.o.   MRN: [id number]  HPI Patient coming in for sinus pressure, cough and sore throat that started. Symptoms started about 4-5 days ago. Patient tested a Covid home test 06/21/23 and it was negative. Patient also states he has left ear pain.  Smokes cigarettes, about 1/4 pack/day.  His sinus symptoms began about a week ago, worse over the past 4 days.  Now producing greenish to clear mucus.  Generalized facial/sinus pressure.  No relief with Claritin.  No active wheezing but has used albuterol in the past for chest tightness.  His current inhaler is out of date.  Taking fluids well.  Voiding normal limit.       Objective:   Physical Exam NAD.  Alert, oriented.  TMs mild clear effusion, no erythema.  Pharynx moderate erythema, no exudate noted.  Mucous membranes moist.  Nares mild bogginess bilaterally.  Neck supple with mild soft anterior adenopathy.  Lungs clear.  Heart regular rate rhythm. Today's Vitals   06/22/23 1203 06/22/23 1205  BP: (!) 152/91 (!) 147/92  Pulse: 74   Temp: 98.2 F (36.8 C)   SpO2: 98%   Weight: 239 lb 6.4 oz (108.6 kg)    Body mass index is 38.64 kg/m.       Assessment & Plan:  Acute bacterial rhinosinusitis Meds ordered this encounter  Medications   doxycycline (VIBRA-TABS) 100 MG tablet    Sig: Take 1 tablet (100 mg total) by mouth 2 (two) times daily.    Dispense:  14 tablet    Refill:  0    Order Specific Question:   Supervising Provider    Answer:   Lilyan Punt A [[id number]]   albuterol (VENTOLIN HFA) 108 (90 Base) MCG/ACT inhaler    Sig: Inhale 2 puffs into the lungs every 6 (six) hours as needed for wheezing or shortness of breath.    Dispense:  8 g    Refill:  0    Order Specific Question:   Supervising Provider    Answer:   Lilyan Punt A [[id number]]   Start doxycycline as directed.  Mucinex DM as directed for congestion and cough.  Continue Claritin as directed.  Given  refill on albuterol inhaler to have on hand in case of chest tightness or wheezing with cough.  Warning signs reviewed.  Call back next week if no improvement, go to ED or urgent care sooner if worse.

## 2023-06-23 ENCOUNTER — Encounter: Payer: Self-pay | Admitting: Nurse Practitioner

## 2023-06-23 MED ORDER — ALBUTEROL SULFATE HFA 108 (90 BASE) MCG/ACT IN AERS: 2.0000 | INHALATION_SPRAY | Freq: Four times a day (QID) | RESPIRATORY_TRACT | 0 refills | Status: AC | PRN

## 2023-08-23 ENCOUNTER — Ambulatory Visit (INDEPENDENT_AMBULATORY_CARE_PROVIDER_SITE_OTHER): Payer: 59 | Admitting: Family Medicine

## 2023-08-23 VITALS — BP 148/80 | HR 80 | Temp 98.5°F | Ht 66.0 in | Wt 237.8 lb

## 2023-08-23 DIAGNOSIS — K047 Periapical abscess without sinus: Secondary | ICD-10-CM | POA: Insufficient documentation

## 2023-08-23 MED ORDER — CLINDAMYCIN HCL 150 MG PO CAPS: 450.0000 mg | ORAL_CAPSULE | Freq: Three times a day (TID) | ORAL | 0 refills | Status: AC

## 2023-08-23 MED ORDER — HYDROCODONE-ACETAMINOPHEN 5-325 MG PO TABS: 1.0000 | ORAL_TABLET | Freq: Three times a day (TID) | ORAL | 0 refills | Status: AC | PRN

## 2023-08-23 NOTE — Progress Notes (Signed)
Subjective:  Patient ID: Elease Hashimoto, male    DOB: 04/28/1971  Age: 52 y.o. MRN: [id number]  CC: Dental infection   HPI:  52 year old male presents for evaluation of the above.  Patient has poor dentition.  He states that he has had dental pain and associated swelling since yesterday.  Located on the right lower dentition.  No fever.  Associated moderate to severe pain.  No relieving factors.  He has not been able to see his dentist yet.  Patient Active Problem List   Diagnosis Date Noted   Dental infection 08/23/2023   Rhinosinusitis 02/24/2023   CAD (coronary artery disease) 02/08/2022   History of ST elevation myocardial infarction (STEMI) 02/08/2022   Essential hypertension 02/08/2022   Class 2 severe obesity with serious comorbidity and body mass index (BMI) of 38.0 to 38.9 in adult [facility]) 02/08/2022   Retinitis pigmentosa 02/08/2022   Tobacco abuse 02/08/2022    Social Hx   Social History   Socioeconomic History   Marital status: Married    Spouse name: Not on file   Number of children: 3   Years of education: Not on file   Highest education level: GED or equivalent  Occupational History   Occupation: Leisure centre manager: [employer]    Comment: on short term disability currently  Tobacco Use   Smoking status: Former    Current packs/day: 0.00    Average packs/day: 1.5 packs/day for 38.0 years (57.0 ttl pk-yrs)    Types: Cigarettes    Start date: 12/25/1982    Quit date: 12/24/2020    Years since quitting: 2.6   Smokeless tobacco: Never  Substance and Sexual Activity   Alcohol use: No   Drug use: No   Sexual activity: Not on file  Other Topics Concern   Not on file  Social History Narrative   Not on file   Social Determinants of Health   Financial Resource Strain: Not on file  Food Insecurity: Not on file  Transportation Needs: Not on file  Physical Activity: Not on file  Stress: Not on file  Social Connections: Unknown  (06/09/2022)   Received from [facility]   Social Network    Social Network: Not on file    Review of Systems Per HPI  Objective:  BP (!) 148/80   Pulse 80   Temp 98.5 F (36.9 C) (Oral)   Ht 5\' 6"  (1.676 m)   Wt 237 lb 12.8 oz (107.9 kg)   SpO2 96%   BMI 38.38 kg/m      08/23/2023    3:07 PM 06/22/2023   12:05 PM 06/22/2023   12:03 PM  BP/Weight  Systolic BP 148 147 152  Diastolic BP 80 92 91  Wt. (Lbs) 237.8  239.4  BMI 38.38 kg/m2  38.64 kg/m2    Physical Exam Constitutional:      General: He is not in acute distress.    Appearance: Normal appearance. He is obese.  HENT:     Mouth/Throat:      Comments: Labeled tooth is eroding.  Swelling noted in this area as well.  Tender to palpation. Pulmonary:     Effort: Pulmonary effort is normal. No respiratory distress.  Neurological:     Mental Status: He is alert.  Psychiatric:        Mood and Affect: Mood normal.        Behavior: Behavior normal.     Lab Results  Component Value Date   WBC 8.5 02/08/2022   HGB 14.8 02/08/2022   HCT 42.8 02/08/2022   PLT 200 02/08/2022   GLUCOSE 98 02/08/2022   CHOL 70 (L) 02/08/2022   TRIG 120 02/08/2022   HDL 32 (L) 02/08/2022   LDLCALC 16 02/08/2022   ALT 46 (H) 02/08/2022   AST 33 02/08/2022   NA 141 02/08/2022   K 3.7 02/08/2022   CL 103 02/08/2022   CREATININE 0.85 02/08/2022   BUN 8 02/08/2022   CO2 23 02/08/2022   INR 1.1 12/25/2020   HGBA1C 6.1 (H) 02/08/2022     Assessment & Plan:   Problem List Items Addressed This Visit       Digestive   Dental infection - Primary    Treating with clindamycin given allergy to Augmentin.  Hydrocodone as needed for pain.  [place] controlled substance database reviewed. Advised to see dentist.      Relevant Medications   clindamycin (CLEOCIN) 150 MG capsule    Meds ordered this encounter  Medications   clindamycin (CLEOCIN) 150 MG capsule    Sig: Take 3 capsules (450 mg total) by  mouth 3 (three) times daily for 7 days.    Dispense:  63 capsule    Refill:  0   HYDROcodone-acetaminophen (NORCO/VICODIN) 5-325 MG tablet    Sig: Take 1 tablet by mouth every 8 (eight) hours as needed for up to 5 days for moderate pain (pain score 4-6) or severe pain (pain score 7-10).    Dispense:  15 tablet    Refill:  0    Ludean Duhart DO [facility]

## 2023-08-23 NOTE — Patient Instructions (Signed)
Medications as prescribed.  Be sure to see your dentist.  Take care  Dr. Lacinda Axon

## 2023-08-23 NOTE — Assessment & Plan Note (Signed)
Treating with clindamycin given allergy to Augmentin.  Hydrocodone as needed for pain.  [place] controlled substance database reviewed. Advised to see dentist.

## 2023-09-29 ENCOUNTER — Ambulatory Visit (INDEPENDENT_AMBULATORY_CARE_PROVIDER_SITE_OTHER): Payer: Commercial Managed Care - PPO | Admitting: Family Medicine

## 2023-09-29 ENCOUNTER — Encounter: Payer: Self-pay | Admitting: Family Medicine

## 2023-09-29 VITALS — BP 134/86 | HR 65 | Temp 98.6°F | Ht 66.0 in | Wt 236.0 lb

## 2023-09-29 DIAGNOSIS — Z130 Encounter for screening for diseases of the blood and blood-forming organs and certain disorders involving the immune mechanism: Secondary | ICD-10-CM

## 2023-09-29 DIAGNOSIS — Z1211 Encounter for screening for malignant neoplasm of colon: Secondary | ICD-10-CM | POA: Insufficient documentation

## 2023-09-29 DIAGNOSIS — Z125 Encounter for screening for malignant neoplasm of prostate: Secondary | ICD-10-CM

## 2023-09-29 DIAGNOSIS — R7303 Prediabetes: Secondary | ICD-10-CM | POA: Diagnosis not present

## 2023-09-29 DIAGNOSIS — I2510 Atherosclerotic heart disease of native coronary artery without angina pectoris: Secondary | ICD-10-CM

## 2023-09-29 DIAGNOSIS — I10 Essential (primary) hypertension: Secondary | ICD-10-CM

## 2023-09-29 DIAGNOSIS — Z720 Tobacco use: Secondary | ICD-10-CM

## 2023-09-29 MED ORDER — NITROGLYCERIN 0.4 MG SL SUBL: 0.4000 mg | SUBLINGUAL_TABLET | SUBLINGUAL | 0 refills | Status: DC | PRN

## 2023-09-29 MED ORDER — ATORVASTATIN CALCIUM 80 MG PO TABS: 80.0000 mg | ORAL_TABLET | Freq: Every day | ORAL | 3 refills | Status: DC

## 2023-09-29 MED ORDER — METOPROLOL TARTRATE 25 MG PO TABS: 12.5000 mg | ORAL_TABLET | Freq: Two times a day (BID) | ORAL | 3 refills | Status: DC

## 2023-09-29 MED ORDER — LOSARTAN POTASSIUM 25 MG PO TABS: 25.0000 mg | ORAL_TABLET | Freq: Every day | ORAL | 3 refills | Status: DC

## 2023-09-29 NOTE — Assessment & Plan Note (Signed)
Stable.  Labs today.  Continue current medication.

## 2023-09-29 NOTE — Assessment & Plan Note (Signed)
Stable.  The EMR reflects that the medications have expired.  I refilled them today.

## 2023-09-29 NOTE — Progress Notes (Signed)
Subjective:  Patient ID: Ronald Valdez, male    DOB: 06-23-1971  Age: 52 y.o. MRN: [id number]  CC:   Chief Complaint  Patient presents with   6 month follow up    HPI:  52 year old male with hypertension, CAD with a history of STEMI, obesity, retinitis pigmentosa tobacco abuse presents for follow-up.  Patient's blood pressure is stable.  He is on losartan and metoprolol.  Lipids have been well-controlled on Lipitor.  He is requesting labs today.  Patient continues to smoke.  Will discuss smoking cessation today.  Patient is a candidate for lung cancer screening.  Will also discuss today.  Patient has never had a colonoscopy.  Will discuss this with him today.  Denies chest pain or shortness of breath.  Patient Active Problem List   Diagnosis Date Noted   Encounter for screening colonoscopy 09/29/2023   CAD (coronary artery disease) 02/08/2022   History of ST elevation myocardial infarction (STEMI) 02/08/2022   Essential hypertension 02/08/2022   Class 2 severe obesity with serious comorbidity and body mass index (BMI) of 38.0 to 38.9 in adult [facility]) 02/08/2022   Retinitis pigmentosa 02/08/2022   Tobacco abuse 02/08/2022    Social Hx   Social History   Socioeconomic History   Marital status: Married    Spouse name: Not on file   Number of children: 3   Years of education: Not on file   Highest education level: GED or equivalent  Occupational History   Occupation: Leisure centre manager: [employer]    Comment: on short term disability currently  Tobacco Use   Smoking status: Former    Current packs/day: 0.00    Average packs/day: 1.5 packs/day for 38.0 years (57.0 ttl pk-yrs)    Types: Cigarettes    Start date: 12/25/1982    Quit date: 12/24/2020    Years since quitting: 2.7   Smokeless tobacco: Never  Substance and Sexual Activity   Alcohol use: No   Drug use: No   Sexual activity: Not on file  Other Topics Concern   Not on file   Social History Narrative   Not on file   Social Determinants of Health   Financial Resource Strain: High Risk (09/26/2023)   Overall Financial Resource Strain (CARDIA)    Difficulty of Paying Living Expenses: Hard  Food Insecurity: No Food Insecurity (09/26/2023)   Hunger Vital Sign    Worried About Running Out of Food in the Last Year: Never true    Ran Out of Food in the Last Year: Never true  Transportation Needs: No Transportation Needs (09/26/2023)   PRAPARE - Administrator, Civil Service (Medical): No    Lack of Transportation (Non-Medical): No  Physical Activity: Insufficiently Active (09/26/2023)   Exercise Vital Sign    Days of Exercise per Week: 5 days    Minutes of Exercise per Session: 10 min  Stress: No Stress Concern Present (09/26/2023)   [employer] of Occupational Health - Occupational Stress Questionnaire    Feeling of Stress : Not at all  Social Connections: Moderately Isolated (09/26/2023)   Social Connection and Isolation Panel [NHANES]    Frequency of Communication with Friends and Family: More than three times a week    Frequency of Social Gatherings with Friends and Family: More than three times a week    Attends Religious Services: Never    Database administrator or Organizations: No    Attends Banker  Meetings: Not on file    Marital Status: Married    Review of Systems Per HPI  Objective:  BP 134/86   Pulse 65   Temp 98.6 F (37 C)   Ht 5\' 6"  (1.676 m)   Wt 236 lb (107 kg)   SpO2 97%   BMI 38.09 kg/m      09/29/2023    8:48 AM 08/23/2023    3:07 PM 06/22/2023   12:05 PM  BP/Weight  Systolic BP 134 148 147  Diastolic BP 86 80 92  Wt. (Lbs) 236 237.8   BMI 38.09 kg/m2 38.38 kg/m2     Physical Exam Vitals and nursing note reviewed.  Constitutional:      General: He is not in acute distress.    Appearance: Normal appearance.  HENT:     Head: Normocephalic and atraumatic.  Eyes:     General:         Right eye: No discharge.        Left eye: No discharge.     Conjunctiva/sclera: Conjunctivae normal.  Cardiovascular:     Rate and Rhythm: Normal rate and regular rhythm.  Pulmonary:     Effort: Pulmonary effort is normal.     Breath sounds: Normal breath sounds. No wheezing, rhonchi or rales.  Neurological:     Mental Status: He is alert.  Psychiatric:        Mood and Affect: Mood normal.        Behavior: Behavior normal.     Lab Results  Component Value Date   WBC 8.5 02/08/2022   HGB 14.8 02/08/2022   HCT 42.8 02/08/2022   PLT 200 02/08/2022   GLUCOSE 98 02/08/2022   CHOL 70 (L) 02/08/2022   TRIG 120 02/08/2022   HDL 32 (L) 02/08/2022   LDLCALC 16 02/08/2022   ALT 46 (H) 02/08/2022   AST 33 02/08/2022   NA 141 02/08/2022   K 3.7 02/08/2022   CL 103 02/08/2022   CREATININE 0.85 02/08/2022   BUN 8 02/08/2022   CO2 23 02/08/2022   INR 1.1 12/25/2020   HGBA1C 6.1 (H) 02/08/2022     Assessment & Plan:   Problem List Items Addressed This Visit       Cardiovascular and Mediastinum   CAD (coronary artery disease)    Stable.  Labs today.  Continue current medication.      Relevant Medications   aspirin EC 81 MG tablet   atorvastatin (LIPITOR) 80 MG tablet   losartan (COZAAR) 25 MG tablet   metoprolol tartrate (LOPRESSOR) 25 MG tablet   nitroGLYCERIN (NITROSTAT) 0.4 MG SL tablet   Other Relevant Orders   Lipid panel   Essential hypertension - Primary    Stable.  The EMR reflects that the medications have expired.  I refilled them today.      Relevant Medications   aspirin EC 81 MG tablet   atorvastatin (LIPITOR) 80 MG tablet   losartan (COZAAR) 25 MG tablet   metoprolol tartrate (LOPRESSOR) 25 MG tablet   nitroGLYCERIN (NITROSTAT) 0.4 MG SL tablet   Other Relevant Orders   CMP14+EGFR   Microalbumin / creatinine urine ratio     Other   Encounter for screening colonoscopy    The patient is amenable to getting colonoscopy.  Referral placed.       Relevant Orders   Ambulatory referral to Gastroenterology   Tobacco abuse    Recommended smoking cessation.  Offered Wellbutrin or Chantix.  Patient wants to  do it on his own.  Also recommended lung cancer screening.  He wants to wait at this time.      Other Visit Diagnoses     Prediabetes       Relevant Orders   Hemoglobin A1c   Microalbumin / creatinine urine ratio   Screening for deficiency anemia       Relevant Orders   CBC   Screening PSA (prostate specific antigen)       Relevant Orders   PSA       Meds ordered this encounter  Medications   atorvastatin (LIPITOR) 80 MG tablet    Sig: Take 1 tablet (80 mg total) by mouth daily.    Dispense:  90 tablet    Refill:  3   losartan (COZAAR) 25 MG tablet    Sig: Take 1 tablet (25 mg total) by mouth daily.    Dispense:  90 tablet    Refill:  3   metoprolol tartrate (LOPRESSOR) 25 MG tablet    Sig: Take 0.5 tablets (12.5 mg total) by mouth 2 (two) times daily.    Dispense:  90 tablet    Refill:  3   nitroGLYCERIN (NITROSTAT) 0.4 MG SL tablet    Sig: Place 1 tablet (0.4 mg total) under the tongue every 5 (five) minutes as needed for chest pain.    Dispense:  30 tablet    Refill:  0    Follow-up:  Return in about 6 months (around 03/29/2024).  Everlene Other DO [facility]

## 2023-09-29 NOTE — Assessment & Plan Note (Signed)
The patient is amenable to getting colonoscopy.  Referral placed.

## 2023-09-29 NOTE — Patient Instructions (Signed)
Labs ordered.  Referral placed for colonoscopy.  Recommend smoking cessation.  Recommend lung cancer screening.  Follow-up in 6 months.

## 2023-09-29 NOTE — Assessment & Plan Note (Signed)
Recommended smoking cessation.  Offered Wellbutrin or Chantix.  Patient wants to do it on his own.  Also recommended lung cancer screening.  He wants to wait at this time.

## 2023-09-30 LAB — CMP14+EGFR
ALT: 36 [IU]/L (ref 0–44)
AST: 23 [IU]/L (ref 0–40)
Albumin: 4.6 g/dL (ref 3.8–4.9)
Alkaline Phosphatase: 94 [IU]/L (ref 44–121)
BUN/Creatinine Ratio: 10 (ref 9–20)
BUN: 10 mg/dL (ref 6–24)
Bilirubin Total: 0.3 mg/dL (ref 0.0–1.2)
CO2: 25 mmol/L (ref 20–29)
Calcium: 10 mg/dL (ref 8.7–10.2)
Chloride: 100 mmol/L (ref 96–106)
Creatinine, Ser: 1.03 mg/dL (ref 0.76–1.27)
Globulin, Total: 2.5 g/dL (ref 1.5–4.5)
Glucose: 87 mg/dL (ref 70–99)
Potassium: 4.1 mmol/L (ref 3.5–5.2)
Sodium: 140 mmol/L (ref 134–144)
Total Protein: 7.1 g/dL (ref 6.0–8.5)
eGFR: 87 mL/min/{1.73_m2} (ref >59)

## 2023-09-30 LAB — LIPID PANEL
Chol/HDL Ratio: 2.7 {ratio} (ref 0.0–5.0)
Cholesterol, Total: 92 mg/dL — ABNORMAL LOW (ref 100–199)
HDL: 34 mg/dL — ABNORMAL LOW (ref >39)
LDL Chol Calc (NIH): 34 mg/dL (ref 0–99)
Triglycerides: 136 mg/dL (ref 0–149)
VLDL Cholesterol Cal: 24 mg/dL (ref 5–40)

## 2023-09-30 LAB — CBC
Hematocrit: 50 % (ref 37.5–51.0)
Hemoglobin: 16.9 g/dL (ref 13.0–17.7)
MCH: 31.6 pg (ref 26.6–33.0)
MCHC: 33.8 g/dL (ref 31.5–35.7)
MCV: 94 fL (ref 79–97)
Platelets: 248 10*3/uL (ref 150–450)
RBC: 5.35 x10E6/uL (ref 4.14–5.80)
RDW: 12.3 % (ref 11.6–15.4)
WBC: 11.2 10*3/uL — ABNORMAL HIGH (ref 3.4–10.8)

## 2023-09-30 LAB — MICROALBUMIN / CREATININE URINE RATIO
Creatinine, Urine: 105 mg/dL
Microalb/Creat Ratio: 10 mg/g{creat} (ref 0–29)
Microalbumin, Urine: 10.6 ug/mL

## 2023-09-30 LAB — HEMOGLOBIN A1C
Est. average glucose Bld gHb Est-mCnc: 123 mg/dL
Hgb A1c MFr Bld: 5.9 % — ABNORMAL HIGH (ref 4.8–5.6)

## 2023-09-30 LAB — PSA: Prostate Specific Ag, Serum: 0.9 ng/mL (ref 0.0–4.0)

## 2023-10-04 ENCOUNTER — Encounter: Payer: Self-pay | Admitting: *Deleted

## 2023-10-10 ENCOUNTER — Ambulatory Visit: Payer: Commercial Managed Care - PPO | Admitting: Family Medicine

## 2023-10-10 VITALS — BP 125/73 | HR 70 | Temp 97.5°F | Ht 66.0 in | Wt 236.0 lb

## 2023-10-10 DIAGNOSIS — J0100 Acute maxillary sinusitis, unspecified: Secondary | ICD-10-CM

## 2023-10-10 MED ORDER — PROMETHAZINE-DM 6.25-15 MG/5ML PO SYRP: 5.0000 mL | ORAL_SOLUTION | Freq: Four times a day (QID) | ORAL | 0 refills | Status: DC | PRN

## 2023-10-10 MED ORDER — DOXYCYCLINE HYCLATE 100 MG PO TABS: 100.0000 mg | ORAL_TABLET | Freq: Two times a day (BID) | ORAL | 0 refills | Status: DC

## 2023-10-10 NOTE — Patient Instructions (Signed)
Medications as prescribed. ° °Take care ° °Dr. Allon Costlow  °

## 2023-10-10 NOTE — Assessment & Plan Note (Signed)
Treating with doxycycline.  Promethazine DM for cough.

## 2023-10-10 NOTE — Progress Notes (Signed)
Subjective:  Patient ID: Elease Hashimoto, male    DOB: Oct 01, 1971  Age: 52 y.o. MRN: [id number]  CC:   Chief Complaint  Patient presents with   Cough    Mixed productive, Body aches, sinus congestion, fever 2 to 3 days    HPI:  52 year old male presents for evaluation of the above.  Patient reports respiratory symptoms over the past several days.  Has had ongoing fever for the past few days up until yesterday morning.  No fever today.  He reports productive cough.  He reports associated sinus congestion and pressure.  He reports body aches as well.  No relieving factors.  No other complaints.  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   Tobacco abuse 02/08/2022   Sinusitis 02/08/2022    Social Hx   Social History   Socioeconomic History   Marital status: Married    Spouse name: Not on file   Number of children: 3   Years of education: Not on file   Highest education level: GED or equivalent  Occupational History   Occupation: Leisure centre manager: [employer]    Comment: on short term disability currently  Tobacco Use   Smoking status: Former    Current packs/day: 0.00    Average packs/day: 1.5 packs/day for 38.0 years (57.0 ttl pk-yrs)    Types: Cigarettes    Start date: 12/25/1982    Quit date: 12/24/2020    Years since quitting: 2.7   Smokeless tobacco: Never  Substance and Sexual Activity   Alcohol use: No   Drug use: No   Sexual activity: Not on file  Other Topics Concern   Not on file  Social History Narrative   Not on file   Social Drivers of Health   Financial Resource Strain: High Risk (09/26/2023)   Overall Financial  Resource Strain (CARDIA)    Difficulty of Paying Living Expenses: Hard  Food Insecurity: No Food Insecurity (09/26/2023)   Hunger Vital Sign    Worried About Running Out of Food in the Last Year: Never true    Ran Out of Food in the Last Year: Never true  Transportation Needs: No Transportation Needs (09/26/2023)   PRAPARE - Administrator, Civil Service (Medical): No    Lack of Transportation (Non-Medical): No  Physical Activity: Insufficiently Active (09/26/2023)   Exercise Vital Sign    Days of Exercise per Week: 5 days    Minutes of Exercise per Session: 10 min  Stress: No Stress Concern Present (09/26/2023)   [employer] of Occupational Health - Occupational Stress Questionnaire    Feeling of Stress : Not at all  Social Connections: Moderately Isolated (09/26/2023)   Social Connection and Isolation Panel [NHANES]    Frequency of Communication with Friends and Family: More than three times a week    Frequency of Social Gatherings with Friends and Family: More than three times a week    Attends Religious Services: Never    Database administrator or Organizations: No    Attends Engineer, structural: Not on file    Marital Status: Married    Review of Systems Per  HPI  Objective:  BP 125/73   Pulse 70   Temp (!) 97.5 F (36.4 C)   Ht 5\' 6"  (1.676 m)   Wt 236 lb (107 kg)   SpO2 97%   BMI 38.09 kg/m      10/10/2023    2:11 PM 09/29/2023    8:48 AM 08/23/2023    3:07 PM  BP/Weight  Systolic BP 125 134 148  Diastolic BP 73 86 80  Wt. (Lbs) 236 236 237.8  BMI 38.09 kg/m2 38.09 kg/m2 38.38 kg/m2    Physical Exam Vitals and nursing note reviewed.  Constitutional:      General: He is not in acute distress.    Appearance: Normal appearance.  HENT:     Head: Normocephalic and atraumatic.     Nose: Congestion present.     Mouth/Throat:     Pharynx: Posterior oropharyngeal erythema present.  Eyes:     General:        Right eye: No discharge.         Left eye: No discharge.     Conjunctiva/sclera: Conjunctivae normal.  Cardiovascular:     Rate and Rhythm: Normal rate and regular rhythm.  Pulmonary:     Effort: Pulmonary effort is normal.     Breath sounds: Normal breath sounds. No wheezing, rhonchi or rales.  Neurological:     Mental Status: He is alert.  Psychiatric:        Mood and Affect: Mood normal.        Behavior: Behavior normal.     Lab Results  Component Value Date   WBC 11.2 (H) 09/29/2023   HGB 16.9 09/29/2023   HCT 50.0 09/29/2023   PLT 248 09/29/2023   GLUCOSE 87 09/29/2023   CHOL 92 (L) 09/29/2023   TRIG 136 09/29/2023   HDL 34 (L) 09/29/2023   LDLCALC 34 09/29/2023   ALT 36 09/29/2023   AST 23 09/29/2023   NA 140 09/29/2023   K 4.1 09/29/2023   CL 100 09/29/2023   CREATININE 1.03 09/29/2023   BUN 10 09/29/2023   CO2 25 09/29/2023   INR 1.1 12/25/2020   HGBA1C 5.9 (H) 09/29/2023     Assessment & Plan:   Problem List Items Addressed This Visit       Respiratory   Sinusitis - Primary   Treating with doxycycline.  Promethazine DM for cough.      Relevant Medications   doxycycline (VIBRA-TABS) 100 MG tablet   promethazine-dextromethorphan (PROMETHAZINE-DM) 6.25-15 MG/5ML syrup    Meds ordered this encounter  Medications   doxycycline (VIBRA-TABS) 100 MG tablet    Sig: Take 1 tablet (100 mg total) by mouth 2 (two) times daily. Take with food and water.    Dispense:  14 tablet    Refill:  0   promethazine-dextromethorphan (PROMETHAZINE-DM) 6.25-15 MG/5ML syrup    Sig: Take 5 mLs by mouth 4 (four) times daily as needed for cough.    Dispense:  118 mL    Refill:  0   Keontay Vora DO [facility]

## 2023-11-20 ENCOUNTER — Telehealth: Payer: Self-pay | Admitting: Family Medicine

## 2023-11-20 NOTE — Telephone Encounter (Signed)
E2C2 sent message on patient stating he is checking on disability papers. He is needing by 1/31. Papers are in your box .

## 2023-12-29 ENCOUNTER — Telehealth (INDEPENDENT_AMBULATORY_CARE_PROVIDER_SITE_OTHER): Admitting: Family Medicine

## 2023-12-29 ENCOUNTER — Ambulatory Visit: Payer: Self-pay | Admitting: Family Medicine

## 2023-12-29 VITALS — BP 135/84 | Temp 99.0°F | Ht 69.0 in | Wt 234.0 lb

## 2023-12-29 DIAGNOSIS — J019 Acute sinusitis, unspecified: Secondary | ICD-10-CM

## 2023-12-29 MED ORDER — DOXYCYCLINE HYCLATE 100 MG PO TABS: 100.0000 mg | ORAL_TABLET | Freq: Two times a day (BID) | ORAL | 0 refills | Status: DC

## 2023-12-29 MED ORDER — PROMETHAZINE-DM 6.25-15 MG/5ML PO SYRP: 5.0000 mL | ORAL_SOLUTION | Freq: Four times a day (QID) | ORAL | 0 refills | Status: DC | PRN

## 2023-12-29 NOTE — Telephone Encounter (Signed)
 noted

## 2023-12-29 NOTE — Telephone Encounter (Signed)
  Chief Complaint: sinus congestion/pain Symptoms: pain in forehead and under eyes, sore throat in morning, cough, congestion in head, light green nasal discharge Frequency: about 4 days Pertinent Negatives: Patient denies sob, fever, difficulty breathing Disposition: [] ED /[] Urgent Care (no appt availability in office) / [x] Appointment(In office/virtual)/ []  [place] [facility]/ [] Home Care/ [] Refused Recommended Disposition /[] [place] [facility]/ []  Follow-up with PCP Additional Notes: pt states that he has had this before and it was a sinus infection. Pt states that he has a temp of 99. Pain in the forehead and under the eyes. Pt states that he is having light green nasal discharge. Pt sched for a vv today with a CH provide.  Copied from CRM [id number]. Topic: Clinical - Red Word Triage >> Dec 29, 2023 11:09 AM Elle L wrote: Red Word that prompted transfer to Nurse Triage: The patient states he has discolored mucus, worsening cough, and a low grade fever. Reason for Disposition  [1] Sinus congestion as part of a cold AND [2] present < 10 days  Answer Assessment - Initial Assessment Questions 1. LOCATION: "Where does it hurt?"      Little on forehead on up under eyes 2. ONSET: "When did the sinus pain start?"  (e.g., hours, days)      Over the last 4 days, each day I wake worse 3. SEVERITY: "How bad is the pain?"   (Scale 1-10; mild, moderate or severe)   - MILD (1-3): doesn't interfere with normal activities    - MODERATE (4-7): interferes with normal activities (e.g., work or school) or awakens from sleep   - SEVERE (8-10): excruciating pain and patient unable to do any normal activities        4 4. RECURRENT SYMPTOM: "Have you ever had sinus problems before?" If Yes, ask: "When was the last time?" and "What happened that time?"      Yes, sinus infection 5. NASAL CONGESTION: "Is the nose blocked?" If Yes, ask: "Can you open it or must you breathe through your mouth?"     yes 6.  NASAL DISCHARGE: "Do you have discharge from your nose?" If so ask, "What color?"     Mucus is light great 7. FEVER: "Do you have a fever?" If Yes, ask: "What is it, how was it measured, and when did it start?"      99 8. OTHER SYMPTOMS: "Do you have any other symptoms?" (e.g., sore throat, cough, earache, difficulty breathing)     Cough, sore throat,  Protocols used: Sinus Pain or Congestion-A-AH

## 2023-12-29 NOTE — Progress Notes (Signed)
 Virtual visit completed through caregility or similar program Patient location: home  Provider location: [place] at [facility], office  Participants: Patient and me (unless stated otherwise below)  Limitations and rationale for visit method d/w patient.  Patient agreed to proceed.  Patient identified by 2 identifiers. If vitals are not listed, then patient was unable to self-report due to a lack of equipment at home via telehealth  CC: cough, sinus pressure.   HPI: facial pressure. B max pressure.  Congested.  Cough.  Started about earlier this week.  No fevers, temp 99.  No vomiting.  No diarrhea.  No rash.  No wheeze.  Some cough with sputum.  Can still get a deep breath.  No recent SABA use or needed.   No BLE edema and he can still lay flat.    Meds and allergies reviewed.   ROS: Per HPI unless specifically indicated in ROS section   NAD Speech wnl  A/P: Presumed sinusitis.  Can use promethazine-dextromethorphan as needed with SABA prn for cough.  Rest and fluids.  Start doxy with routine cautions.  He agrees.

## 2023-12-31 NOTE — Assessment & Plan Note (Signed)
 Presumed sinusitis.  Can use promethazine-dextromethorphan as needed with SABA prn for cough.  Rest and fluids.  Start doxy with routine cautions.  He agrees.

## 2024-03-12 ENCOUNTER — Encounter: Payer: Self-pay | Admitting: Nurse Practitioner

## 2024-03-12 ENCOUNTER — Ambulatory Visit: Payer: Self-pay

## 2024-03-12 ENCOUNTER — Ambulatory Visit (INDEPENDENT_AMBULATORY_CARE_PROVIDER_SITE_OTHER): Admitting: Nurse Practitioner

## 2024-03-12 VITALS — BP 124/73 | HR 63 | Temp 98.2°F | Ht 69.0 in | Wt 234.0 lb

## 2024-03-12 DIAGNOSIS — J029 Acute pharyngitis, unspecified: Secondary | ICD-10-CM

## 2024-03-12 DIAGNOSIS — J069 Acute upper respiratory infection, unspecified: Secondary | ICD-10-CM | POA: Diagnosis not present

## 2024-03-12 MED ORDER — AZITHROMYCIN 250 MG PO TABS: ORAL_TABLET | ORAL | 0 refills | Status: DC

## 2024-03-12 NOTE — Progress Notes (Signed)
   Subjective:    Patient ID: Ronald Valdez, male    DOB: [birth year], 53 y.o.   MRN: [id number]  HPI Presents for complaints of sore throat and coughing for the past 3 to 4 days.  Low-grade fever.  Had a headache yesterday but this has resolved.  Occasional congested cough.  No shortness of breath or wheezing or chest pain.  Producing slight green sputum at times.  No ear pain.  Slight nausea, no vomiting.  No diarrhea or abdominal pain.  No rash.  His son is being seen today as well, had a positive strep test.  Has similar symptoms.  Taking fluids well.  Voiding normal limit.  Has been using OTC meds for symptomatic care.        Objective:   Physical Exam NAD.  Alert, oriented.  TMs retracted bilaterally, no erythema.  Pharynx moderate generalized erythema, no exudate or lesions.  Neck supple with mild soft anterior cervical adenopathy.  Lungs clear.  Heart regular rate rhythm. Today's Vitals   03/12/24 1433  BP: 124/73  Pulse: 63  Temp: 98.2 F (36.8 C)  SpO2: 97%  Weight: 234 lb (106.1 kg)  Height: 5\' 9"  (1.753 m)   Body mass index is 34.56 kg/m.        Assessment & Plan:  Viral URI with cough  Acute pharyngitis, unspecified etiology Meds ordered this encounter  Medications   azithromycin (ZITHROMAX Z-PAK) 250 MG tablet    Sig: Take 2 tablets (500 mg) on  Day 1,  followed by 1 tablet (250 mg) once daily on Days 2 through 5.    Dispense:  6 each    Refill:  0    Supervising Provider:   Charlotta Cook A [[id number]]   Start Z-Pak as directed.  Continue OTC meds as directed for symptomatic care.  Warning signs reviewed.  Call back if worsens or persist.

## 2024-03-12 NOTE — Telephone Encounter (Signed)
  Chief Complaint: cough Symptoms: cough Frequency: 5-5 days Pertinent Negatives: Patient denies fever ans sob. Disposition: [] ED /[] Urgent Care (no appt availability in office) / [x] Appointment(In office/virtual)/ []  [place] [facility]/ [] Home Care/ [] Refused Recommended Disposition /[] [place] [facility]/ []  Follow-up with PCP  Additional Notes: States that he has been having a cough, sore throat, sinus drainage. States cough and sinus drainage started about 5-6 days ago and sore throat started yesterday. States coughing up green sputum.    Copied from CRM [id number]. Topic: Clinical - Red Word Triage >> Mar 12, 2024  8:43 AM Essie A wrote: Red Word that prompted transfer to Nurse Triage: sore throat and cough Reason for Disposition  [1] Continuous (nonstop) coughing interferes with work or school AND [2] no improvement using cough treatment per Care Advice  Answer Assessment - Initial Assessment Questions 1. ONSET: "When did the cough begin?"      5-6 days  2. SEVERITY: "How bad is the cough today?"      mod 3. SPUTUM: "Describe the color of your sputum" (none, dry cough; clear, white, yellow, green)     green 4. HEMOPTYSIS: "Are you coughing up any blood?" If so ask: "How much?" (flecks, streaks, tablespoons, etc.)     no 5. DIFFICULTY BREATHING: "Are you having difficulty breathing?" If Yes, ask: "How bad is it?" (e.g., mild, moderate, severe)    - MILD: No SOB at rest, mild SOB with walking, speaks normally in sentences, can lie down, no retractions, pulse < 100.    - MODERATE: SOB at rest, SOB with minimal exertion and prefers to sit, cannot lie down flat, speaks in phrases, mild retractions, audible wheezing, pulse 100-120.    - SEVERE: Very SOB at rest, speaks in single words, struggling to breathe, sitting hunched forward, retractions, pulse > 120      no 6. FEVER: "Do you have a fever?" If Yes, ask: "What is your temperature, how was it measured, and when did it  start?"     no 7. CARDIAC HISTORY: "Do you have any history of heart disease?" (e.g., heart attack, congestive heart failure)      Heart attach 3 years 8. LUNG HISTORY: "Do you have any history of lung disease?"  (e.g., pulmonary embolus, asthma, emphysema)     no 9. PE RISK FACTORS: "Do you have a history of blood clots?" (or: recent major surgery, recent prolonged travel, bedridden)     no 10. OTHER SYMPTOMS: "Do you have any other symptoms?" (e.g., runny nose, wheezing, chest pain)       Sinus pressure and sore throat  Protocols used: Cough - Acute Productive-A-AH

## 2024-03-29 ENCOUNTER — Ambulatory Visit: Payer: Commercial Managed Care - PPO | Admitting: Family Medicine

## 2024-03-29 ENCOUNTER — Encounter: Payer: Self-pay | Admitting: Family Medicine

## 2024-03-29 VITALS — BP 132/84 | HR 71 | Temp 98.1°F | Ht 69.0 in | Wt 234.0 lb

## 2024-03-29 DIAGNOSIS — E785 Hyperlipidemia, unspecified: Secondary | ICD-10-CM

## 2024-03-29 DIAGNOSIS — Z1211 Encounter for screening for malignant neoplasm of colon: Secondary | ICD-10-CM

## 2024-03-29 DIAGNOSIS — I2510 Atherosclerotic heart disease of native coronary artery without angina pectoris: Secondary | ICD-10-CM

## 2024-03-29 DIAGNOSIS — K029 Dental caries, unspecified: Secondary | ICD-10-CM | POA: Diagnosis not present

## 2024-03-29 DIAGNOSIS — I10 Essential (primary) hypertension: Secondary | ICD-10-CM | POA: Diagnosis not present

## 2024-03-29 MED ORDER — CLINDAMYCIN HCL 150 MG PO CAPS: 450.0000 mg | ORAL_CAPSULE | Freq: Three times a day (TID) | ORAL | 0 refills | Status: AC

## 2024-03-29 NOTE — Patient Instructions (Signed)
Medication as prescribed.  Follow up in 6 months.  Referral placed for colonoscopy.

## 2024-03-31 DIAGNOSIS — K029 Dental caries, unspecified: Secondary | ICD-10-CM | POA: Insufficient documentation

## 2024-03-31 DIAGNOSIS — E785 Hyperlipidemia, unspecified: Secondary | ICD-10-CM | POA: Insufficient documentation

## 2024-03-31 MED ORDER — ATORVASTATIN CALCIUM 80 MG PO TABS: 80.0000 mg | ORAL_TABLET | Freq: Every day | ORAL | 3 refills | Status: AC

## 2024-03-31 NOTE — Assessment & Plan Note (Signed)
 At goal on Lipitor . Refilled.

## 2024-03-31 NOTE — Assessment & Plan Note (Signed)
 Clindamycin as directed.

## 2024-03-31 NOTE — Assessment & Plan Note (Signed)
Asymptomatic.  Continue current medications. 

## 2024-03-31 NOTE — Assessment & Plan Note (Signed)
 Stable.  Continue current medications.

## 2024-03-31 NOTE — Progress Notes (Signed)
 Subjective:  Patient ID: Ronald Valdez, male    DOB: 1971-03-02  Age: 53 y.o. MRN: [id number]  CC:   Chief Complaint  Patient presents with   540month follow up   Dental Pain    Request abx, appt w/ dentist in 2 weeks    HPI:  53 year old male with the below mentioned medical problems presents for follow up.  Patient is overdue for many preventative health care items. Discussed these today. He is amendable to colonoscopy. Will place referral.  Denies chest pain or SOB.  BP stable on Losartan  and Metoprolol .   LDL at goal on Lipitor .  Patient reports dental pain (upper dentition). Has upcoming appt with dentist but is concerned that this will worsen. Requesting antibiotic therapy.  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   Tobacco abuse 02/08/2022    Social Hx   Social History   Socioeconomic History   Marital status: Married    Spouse name: Not on file   Number of children: 3   Years of education: Not on file   Highest education level: GED or equivalent  Occupational History   Occupation: Leisure centre manager: [employer]    Comment: on short term disability currently  Tobacco Use   Smoking status: Former    Current packs/day: 0.00    Average packs/day: 1.5 packs/day for 38.0 years (57.0 ttl pk-yrs)    Types: Cigarettes    Start date: 12/25/1982    Quit date: 12/24/2020    Years since quitting: 3.2   Smokeless tobacco: Never  Substance and Sexual Activity   Alcohol use: No   Drug use: No   Sexual activity: Not on file  Other Topics Concern   Not on file   Social History Narrative   Not on file   Social Drivers of Health   Financial Resource Strain: High Risk (12/29/2023)   Overall Financial Resource Strain (CARDIA)    Difficulty of Paying Living Expenses: Hard  Food Insecurity: No Food Insecurity (12/29/2023)   Hunger Vital Sign    Worried About Running Out of Food in the Last Year: Never true    Ran Out of Food in the Last Year: Never true  Transportation Needs: No Transportation Needs (12/29/2023)   PRAPARE - Administrator, Civil Service (Medical): No    Lack of Transportation (Non-Medical): No  Physical Activity: Insufficiently Active (12/29/2023)   Exercise Vital Sign    Days of Exercise per Week: 3 days    Minutes of Exercise per Session: 10 min  Stress: No Stress Concern Present (12/29/2023)   [employer] of Occupational Health - Occupational Stress Questionnaire    Feeling of Stress : Not at all  Social Connections: Moderately Isolated (12/29/2023)   Social Connection and Isolation Panel [NHANES]    Frequency of Communication with Friends and Family: More than three times a week    Frequency of Social Gatherings with Friends and Family: Three times a week    Attends Religious Services: Never    Active Member of Clubs or Organizations: No    Attends  Engineer, structural: Not on file    Marital Status: Married    Review of Systems Per HPI  Objective:  BP 132/84   Pulse 71   Temp 98.1 F (36.7 C)   Ht 5\' 9"  (1.753 m)   Wt 234 lb (106.1 kg)   SpO2 95%   BMI 34.56 kg/m      03/29/2024   11:08 AM 03/12/2024    2:33 PM 12/29/2023    2:50 PM  BP/Weight  Systolic BP 132 124 135  Diastolic BP 84 73 84  Wt. (Lbs) 234 234 234  BMI 34.56 kg/m2 34.56 kg/m2 34.56 kg/m2    Physical Exam Vitals and nursing note reviewed.  Constitutional:      General: He is not in acute distress.    Appearance: Normal appearance.  HENT:     Head: Normocephalic and atraumatic.     Mouth/Throat:     Comments: Poor  dentition. Multiple teeth with advanced decay. Cardiovascular:     Rate and Rhythm: Normal rate and regular rhythm.  Pulmonary:     Effort: Pulmonary effort is normal.     Breath sounds: Normal breath sounds. No wheezing or rales.  Neurological:     Mental Status: He is alert.     Lab Results  Component Value Date   WBC 11.2 (H) 09/29/2023   HGB 16.9 09/29/2023   HCT 50.0 09/29/2023   PLT 248 09/29/2023   GLUCOSE 87 09/29/2023   CHOL 92 (L) 09/29/2023   TRIG 136 09/29/2023   HDL 34 (L) 09/29/2023   LDLCALC 34 09/29/2023   ALT 36 09/29/2023   AST 23 09/29/2023   NA 140 09/29/2023   K 4.1 09/29/2023   CL 100 09/29/2023   CREATININE 1.03 09/29/2023   BUN 10 09/29/2023   CO2 25 09/29/2023   INR 1.1 12/25/2020   HGBA1C 5.9 (H) 09/29/2023     Assessment & Plan:  Essential hypertension Assessment & Plan: Stable. Continue current medications.   Colon cancer screening -     Ambulatory referral to Gastroenterology  Coronary artery disease involving native coronary artery of native heart without angina pectoris Assessment & Plan: Asymptomatic. Continue current medications.     Hyperlipidemia, unspecified hyperlipidemia type Assessment & Plan: At goal on Lipitor . Refilled.  Orders: -     Atorvastatin  Calcium ; Take 1 tablet (80 mg total) by mouth daily.  Dispense: 90 tablet; Refill: 3  Pain due to dental caries Assessment & Plan: Clindamycin  as directed.   Orders: -     Clindamycin  HCl; Take 3 capsules (450 mg total) by mouth 3 (three) times daily for 7 days.  Dispense: 63 capsule; Refill: 0    Follow-up:  6 months  Ronald Chance Debrah Fan DO [facility]

## 2024-04-01 ENCOUNTER — Encounter (INDEPENDENT_AMBULATORY_CARE_PROVIDER_SITE_OTHER): Payer: Self-pay | Admitting: *Deleted

## 2024-06-21 ENCOUNTER — Ambulatory Visit: Payer: Self-pay | Admitting: *Deleted

## 2024-06-21 NOTE — Telephone Encounter (Signed)
 Copied from CRM [id number]. Topic: Clinical - Red Word Triage >> Jun 21, 2024 12:00 PM Ameerah G wrote: Red Word that prompted transfer to Nurse Triage: coughing, congested, greenish mucous.   ----------------------------------------------------------------------- From previous Reason for Contact - Scheduling: Patient/patient representative is calling to schedule an appointment. Refer to attachments for appointment information. Reason for Disposition  [1] Continuous (nonstop) coughing interferes with work or school AND [2] no improvement using cough treatment per Care Advice  Answer Assessment - Initial Assessment Questions 1. ONSET: When did the cough begin?      It's been 3 weeks since this congestion started.   I've been taking left over Clindamycin .   Over the last 4-5 days it's moved into my chest.   It's a lot of coughing.   Sometimes the mucus is clear and sometimes green 2. SEVERITY: How bad is the cough today?      Getting worse 3. SPUTUM: Describe the color of your sputum (e.g., none, dry cough; clear, white, yellow, green)     Clear and green mucus 4. HEMOPTYSIS: Are you coughing up any blood? If Yes, ask: How much? (e.g., flecks, streaks, tablespoons, etc.)     Not asked 5. DIFFICULTY BREATHING: Are you having difficulty breathing? If Yes, ask: How bad is it? (e.g., mild, moderate, severe)      Once in a while short of breath    No chest tightness  6. FEVER: Do you have a fever? If Yes, ask: What is your temperature, how was it measured, and when did it start?     no 7. CARDIAC HISTORY: Do you have any history of heart disease? (e.g., heart attack, congestive heart failure)      Not asked 8. LUNG HISTORY: Do you have any history of lung disease?  (e.g., pulmonary embolus, asthma, emphysema)     I have had asthma in the past. 9. PE RISK FACTORS: Do you have a history of blood clots? (or: recent major surgery, recent prolonged travel, bedridden)     Not  asked 10. OTHER SYMPTOMS: Do you have any other symptoms? (e.g., runny nose, wheezing, chest pain)       Sore throat and a lot of runny nose.  For the last 2 weeks I've taken Covid tests and they were negative.    11. PREGNANCY: Is there any chance you are pregnant? When was your last menstrual period?       N/A 12. TRAVEL: Have you traveled out of the country in the last month? (e.g., travel history, exposures)       N/A  Protocols used: Cough - Acute Productive-A-AH FYI Only or Action Required?: FYI only for provider.  Patient was last seen in primary care on 03/29/2024 by Cook, Jayce G, DO.  Called Nurse Triage reporting Cough.  Symptoms began several weeks ago.  Interventions attempted: Prescription medications: left over Clindamycin  from prior illness   Took for 5 days.   Helped a little but still having runny nose, sore throat and productive cough.   The cough is the big thing.  Been bush hogging the last few days..  Symptoms are: gradually worsening.  Triage Disposition: See Physician Within 24 Hours No appts available.  He is going to the urgent care on [address] in [place].   Patient/caregiver understands and will follow disposition?: Yes

## 2024-06-21 NOTE — Telephone Encounter (Signed)
 Patient states he did a teladoc appt today.

## 2024-07-17 ENCOUNTER — Other Ambulatory Visit: Payer: Self-pay | Admitting: Family Medicine

## 2024-07-17 MED ORDER — AZITHROMYCIN 250 MG PO TABS: ORAL_TABLET | ORAL | 0 refills | Status: AC

## 2024-10-01 ENCOUNTER — Encounter (INDEPENDENT_AMBULATORY_CARE_PROVIDER_SITE_OTHER): Payer: Self-pay | Admitting: *Deleted

## 2024-10-02 ENCOUNTER — Other Ambulatory Visit: Payer: Self-pay | Admitting: Family Medicine
